# Patient Record
Sex: Female | Born: 1970
Health system: Southern US, Academic
[De-identification: ages and names within clinical notes are randomized; demographics above are authoritative.]

## PROBLEM LIST (undated history)

## (undated) ENCOUNTER — Encounter

## (undated) ENCOUNTER — Telehealth

## (undated) ENCOUNTER — Telehealth: Attending: Registered" | Primary: Registered"

## (undated) ENCOUNTER — Ambulatory Visit: Payer: MEDICARE | Attending: Registered" | Primary: Registered"

## (undated) ENCOUNTER — Ambulatory Visit: Payer: MEDICARE

## (undated) ENCOUNTER — Telehealth: Attending: Clinical | Primary: Clinical

## (undated) ENCOUNTER — Ambulatory Visit

## (undated) ENCOUNTER — Encounter: Attending: Physical Medicine & Rehabilitation | Primary: Physical Medicine & Rehabilitation

## (undated) ENCOUNTER — Encounter: Attending: Registered" | Primary: Registered"

## (undated) ENCOUNTER — Inpatient Hospital Stay

## (undated) ENCOUNTER — Ambulatory Visit: Payer: MEDICARE | Attending: Physical Medicine & Rehabilitation | Primary: Physical Medicine & Rehabilitation

## (undated) ENCOUNTER — Encounter: Attending: Internal Medicine | Primary: Internal Medicine

## (undated) ENCOUNTER — Telehealth: Attending: Social Worker | Primary: Social Worker

## (undated) ENCOUNTER — Ambulatory Visit: Payer: MEDICARE | Attending: Addiction (Substance Use Disorder) | Primary: Addiction (Substance Use Disorder)

## (undated) ENCOUNTER — Ambulatory Visit: Payer: MEDICARE | Attending: Family | Primary: Family

## (undated) ENCOUNTER — Non-Acute Institutional Stay: Payer: MEDICARE

## (undated) ENCOUNTER — Ambulatory Visit: Attending: Social Worker | Primary: Social Worker

## (undated) ENCOUNTER — Encounter: Payer: MEDICARE | Attending: Physical Medicine & Rehabilitation | Primary: Physical Medicine & Rehabilitation

## (undated) ENCOUNTER — Encounter
Attending: Student in an Organized Health Care Education/Training Program | Primary: Student in an Organized Health Care Education/Training Program

## (undated) ENCOUNTER — Ambulatory Visit: Payer: MEDICARE | Attending: Social Worker | Primary: Social Worker

## (undated) ENCOUNTER — Telehealth: Attending: Family | Primary: Family

## (undated) ENCOUNTER — Encounter: Attending: Family Medicine | Primary: Family Medicine

## (undated) ENCOUNTER — Encounter: Attending: Social Worker | Primary: Social Worker

## (undated) ENCOUNTER — Encounter
Payer: MEDICARE | Attending: Rehabilitative and Restorative Service Providers" | Primary: Rehabilitative and Restorative Service Providers"

## (undated) ENCOUNTER — Ambulatory Visit: Payer: MEDICARE | Attending: Physician Assistant | Primary: Physician Assistant

## (undated) ENCOUNTER — Encounter: Payer: MEDICARE | Attending: Registered" | Primary: Registered"

## (undated) ENCOUNTER — Telehealth: Attending: Adult Health | Primary: Adult Health

## (undated) ENCOUNTER — Telehealth: Attending: Internal Medicine | Primary: Internal Medicine

## (undated) ENCOUNTER — Encounter: Attending: Family | Primary: Family

## (undated) ENCOUNTER — Encounter: Payer: MEDICARE | Attending: Orthopaedic Surgery | Primary: Orthopaedic Surgery

## (undated) DIAGNOSIS — I10 Essential (primary) hypertension: Secondary | ICD-10-CM

## (undated) DIAGNOSIS — G9050 Complex regional pain syndrome I, unspecified: Secondary | ICD-10-CM

## (undated) HISTORY — PX: LITHOTRIPSY: SUR834

## (undated) HISTORY — PX: TONSILLECTOMY: SUR1361

## (undated) MED ORDER — OXYCODONE-ACETAMINOPHEN 7.5 MG-325 MG TABLET: tablet | 0 refills | 0 days

## (undated) MED ORDER — OXYCODONE-ACETAMINOPHEN 7.5 MG-325 MG TABLET: tablet | Freq: Three times a day (TID) | 0 refills | 0 days | Status: CN

## (undated) MED ORDER — MULTIVITAMIN TABLET: Freq: Every day | ORAL | 0 days

## (undated) MED ORDER — MORPHINE ER 15 MG TABLET,EXTENDED RELEASE: 15 mg | tablet | Freq: Two times a day (BID) | 0 refills | 30 days

## (undated) MED ORDER — CENTRUM ADULT 50 PLUS FRESH-FRUITY 120 MCG CHEWABLE TABLET: Freq: Every day | ORAL | 0 days

## (undated) MED ORDER — OXYCODONE-ACETAMINOPHEN 7.5 MG-325 MG TABLET: 1 | tablet | Freq: Four times a day (QID) | 0 refills | 23 days | Status: CN

---
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
## 2005-04-18 ENCOUNTER — Emergency Department: Payer: Self-pay | Admitting: Internal Medicine

## 2005-06-05 ENCOUNTER — Emergency Department: Payer: Self-pay | Admitting: Internal Medicine

## 2005-10-16 ENCOUNTER — Ambulatory Visit: Payer: Self-pay | Admitting: Family Medicine

## 2005-12-16 ENCOUNTER — Emergency Department: Payer: Self-pay | Admitting: Emergency Medicine

## 2006-04-25 ENCOUNTER — Ambulatory Visit: Payer: Self-pay | Admitting: Obstetrics and Gynecology

## 2006-10-31 ENCOUNTER — Emergency Department: Payer: Self-pay | Admitting: Emergency Medicine

## 2007-10-09 IMAGING — US US PELV - US TRANSVAGINAL
1 series · 17 of 25 positions shown · non-contrast
Comparison: none

REASON FOR EXAM: Leiomyoma
COMMENTS:

[Series 1: us pelv - us transvaginal · 17 of 44 slices shown]
[im 1/44]
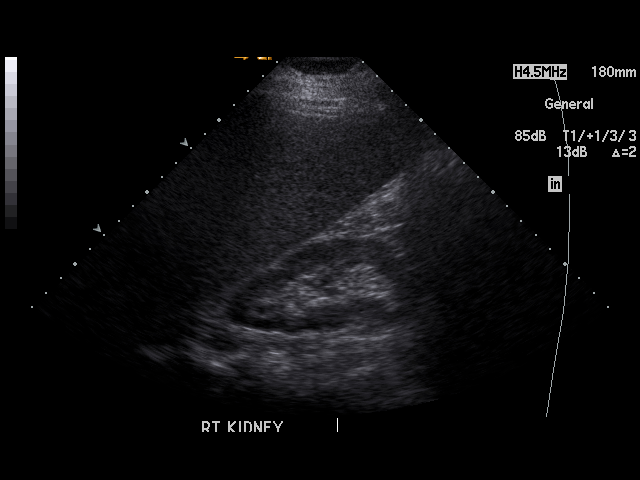
[im 4/44]
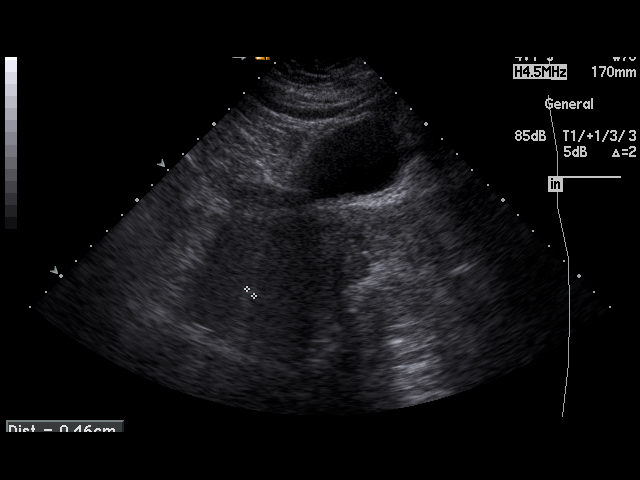
[im 6/44]
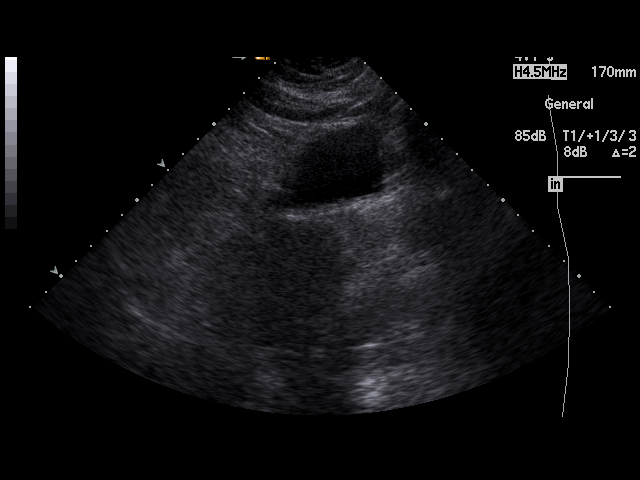
[im 9/44]
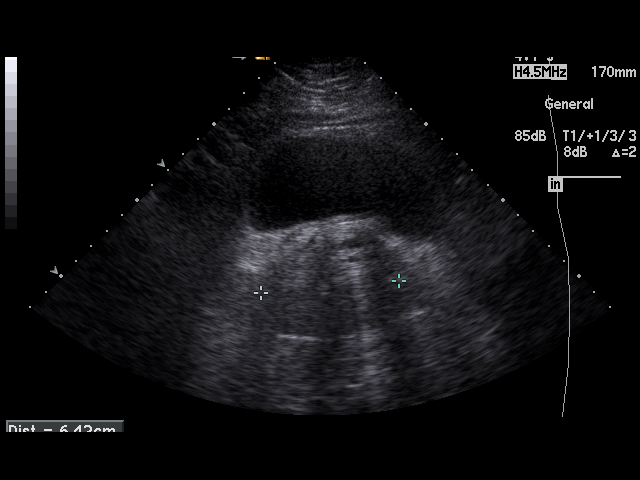
[im 11/44]
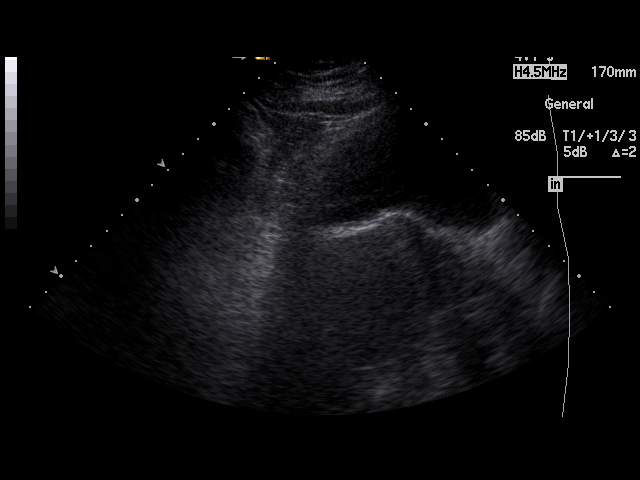
[im 15/44]
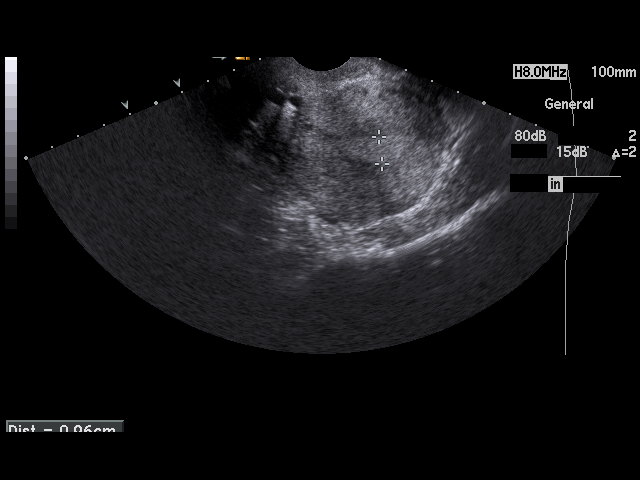
[im 17/44]
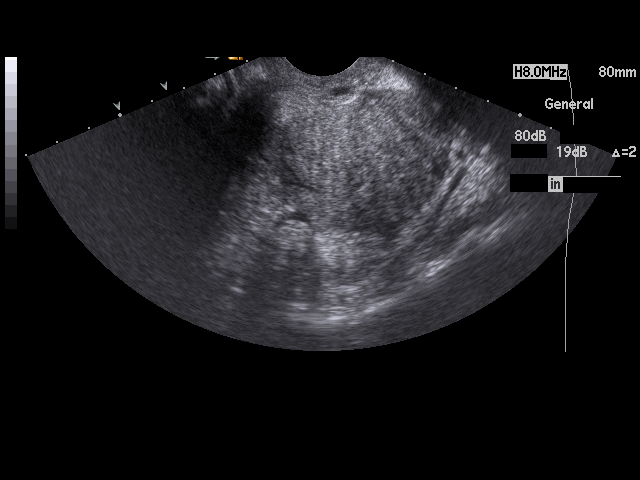
[im 20/44]
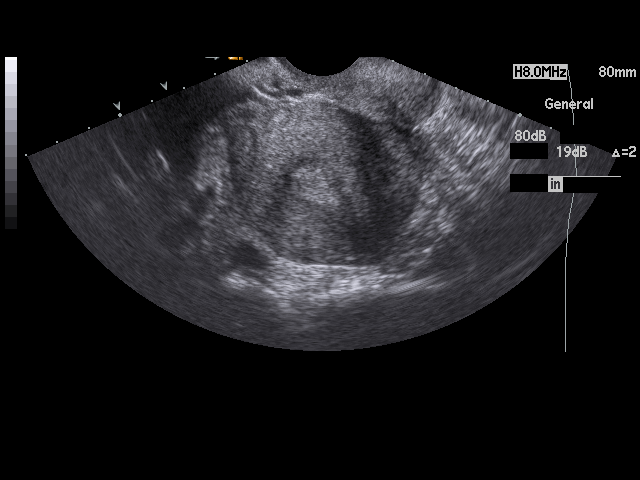
[im 22/44]
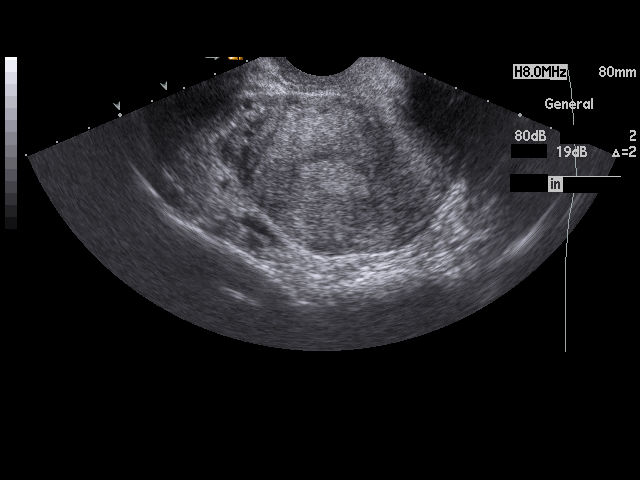
[im 24/44]
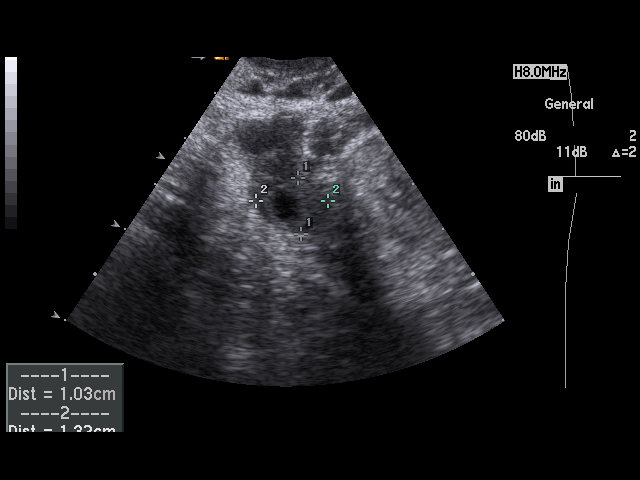
[im 27/44]
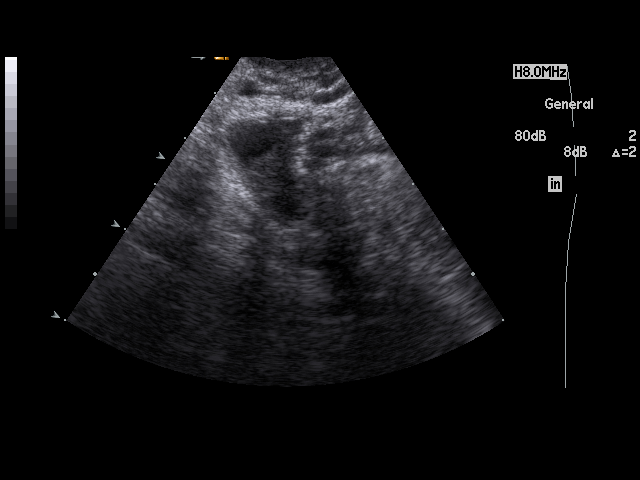
[im 29/44]
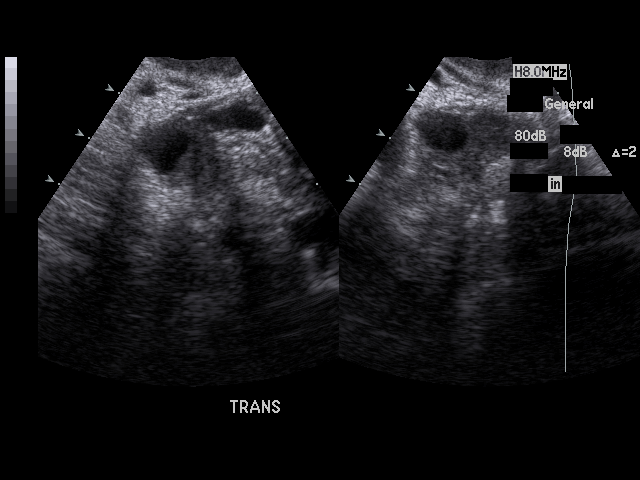
[im 33/44]
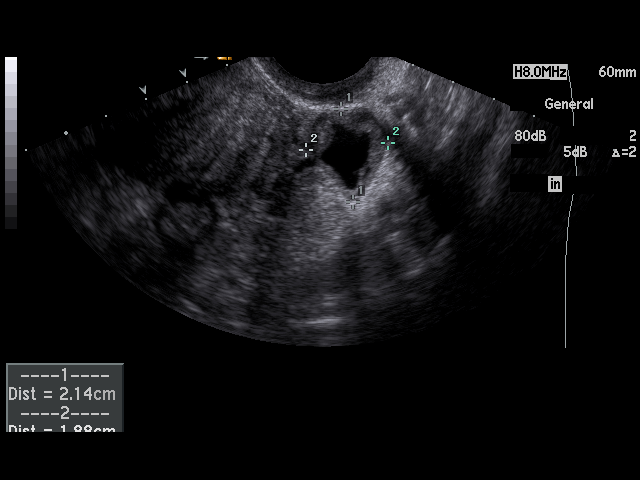
[im 35/44]
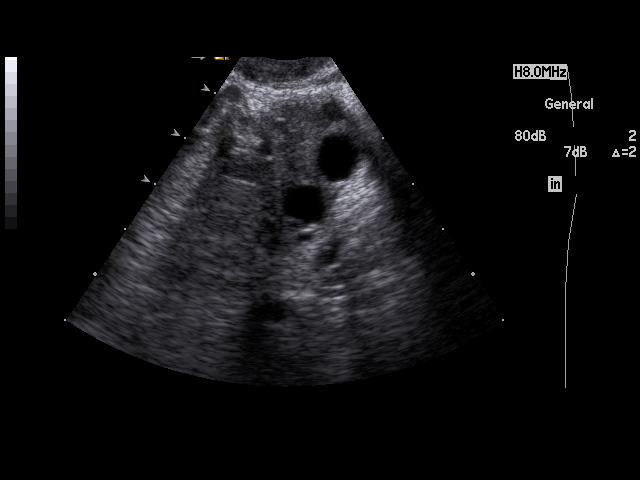
[im 38/44]
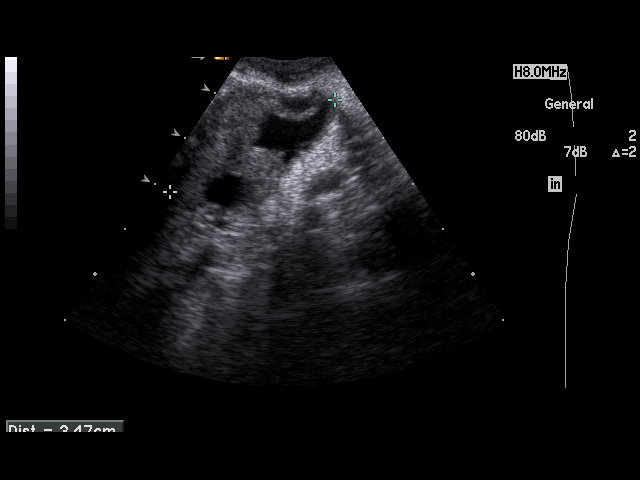
[im 40/44]
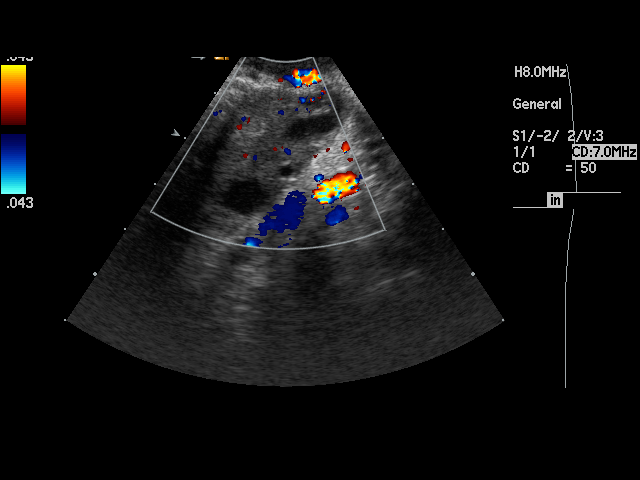
[im 44/44]
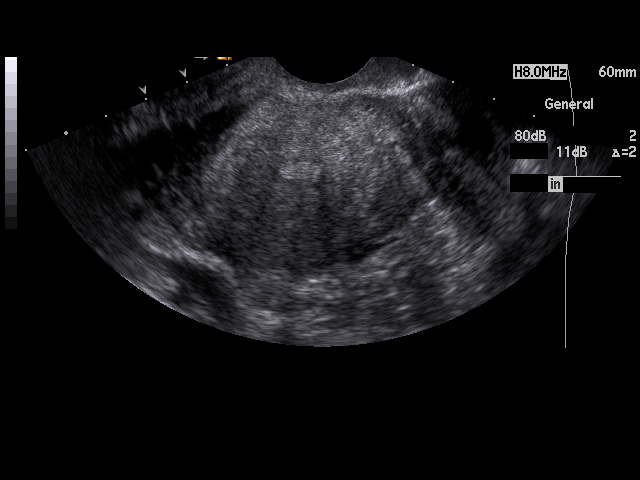

[17 of 25 positions shown; findings below may reference images not displayed]

PROCEDURE:     US  - US PELVIS MASS EXAM  - [DATE] [DATE] [DATE]  [DATE]

RESULT:     Transabdominal and endovaginal ultrasound was performed.

The uterus appears retroflexed. The uterus measures 9.4 cm x 5.27 cm x
cm. No uterine mass lesions are identified. The endometrium measures 9.6 mm
in thickness which is within the limits of normal. The RIGHT and LEFT
ovaries are visualized. The RIGHT ovary measures 2.47 cm at maximum diameter
and the LEFT ovary measures 3.47 cm at maximum diameter. There are multiple
cysts in the LEFT ovary. The largest measures 1.77 cm at maximum diameter
and appears irregular. This likely represents a collapsing cyst. A few,
follicular cysts are seen in the RIGHT ovary. No abnormal adnexal masses are
seen. There is no free fluid in the pelvis.
IMPRESSION: 1.     No uterine mass lesion is identified.
2.     There is a 1.77 cm, irregular cyst of the LEFT ovary that appears to
likely represent a collapsing cyst.
3.     No free fluid is identified in the pelvis.
4.     The uterus is retroflexed.

## 2010-08-28 ENCOUNTER — Ambulatory Visit: Payer: Self-pay | Admitting: Family Medicine

## 2010-10-03 ENCOUNTER — Emergency Department: Payer: Self-pay | Admitting: Unknown Physician Specialty

## 2010-10-10 ENCOUNTER — Ambulatory Visit: Payer: Self-pay | Admitting: Family Medicine

## 2010-10-13 ENCOUNTER — Ambulatory Visit: Payer: Self-pay | Admitting: Urology

## 2016-06-18 ENCOUNTER — Ambulatory Visit: Admission: RE | Admit: 2016-06-18 | Discharge: 1898-01-08 | Attending: Infectious Disease

## 2016-07-09 ENCOUNTER — Ambulatory Visit
Admission: RE | Admit: 2016-07-09 | Discharge: 2016-07-09 | Disposition: A | Discharge status: Home / Self Care | Payer: MEDICARE

## 2016-07-09 DIAGNOSIS — R1030 Lower abdominal pain, unspecified: Principal | ICD-10-CM

## 2016-07-16 ENCOUNTER — Ambulatory Visit
Admission: RE | Admit: 2016-07-16 | Discharge: 2016-07-16 | Disposition: A | Discharge status: Home / Self Care | Payer: MEDICARE

## 2016-07-16 DIAGNOSIS — M545 Low back pain: Principal | ICD-10-CM

## 2016-07-19 MED ORDER — NORETHINDRONE ACETATE 5 MG TABLET
ORAL_TABLET | Freq: Every day | ORAL | 11 refills | 0.00000 days | Status: CP
Start: 2016-07-19 — End: 2016-07-27

## 2016-07-24 ENCOUNTER — Ambulatory Visit: Admission: RE | Admit: 2016-07-24 | Discharge: 2016-07-24 | Payer: MEDICARE

## 2016-07-24 DIAGNOSIS — Z006 Encounter for examination for normal comparison and control in clinical research program: Principal | ICD-10-CM

## 2016-07-24 MED ORDER — PRAVASTATIN 10 MG TABLET
ORAL_TABLET | Freq: Every evening | ORAL | 3 refills | 0 days | Status: CP
Start: 2016-07-24 — End: 2016-08-22

## 2016-07-24 NOTE — Unmapped (Signed)
Pharmacy called at 12:145 pm  Pt needs refill of    pravastatin (PRAVACHOL) 10 MG

## 2016-07-27 MED ORDER — NORETHINDRONE ACETATE 5 MG TABLET
ORAL_TABLET | Freq: Every day | ORAL | 11 refills | 0.00000 days | Status: CP
Start: 2016-07-27 — End: 2017-07-17

## 2016-08-16 NOTE — Unmapped (Signed)
Specialty Pharmacy Refill Coordination Note     Patty Bailey is a 46 y.o. female contacted today regarding refills of her specialty medication(s).    Reviewed and verified with patient:      Specialty medication(s) and dose(s) confirmed: yes  Changes to medications: no  Changes to insurance: no    Medication Adherence    Patient reported X missed doses in the last month:  0  Specialty Medication:  TIVICAY  Patient is on additional specialty medications:  No  Informant:  patient  Confirmed plan for next specialty medication refill:  delivery by pharmacy  Medication Assistance Program  Refill Coordination  Has the Patient's Contact Information Changed:  No  Is the Shipping Address Different:  No  Shipping Information  Delivery Scheduled:  Yes  Delivery Date:  08/22/16  Medications to be Shipped:  ONLY TIVICAY NEEDED AT THIS TIME.          Follow-up: 3 week(s)     Westley Gambles  Specialty Pharmacy Technician

## 2016-08-17 LAB — HIV RNA, QUANTITATIVE, PCR: HIV RNA QNT RSLT: NOT DETECTED

## 2016-08-17 LAB — HIV RNA: Lab: <20

## 2016-08-17 MED ORDER — DOLUTEGRAVIR 50 MG TABLET
ORAL_TABLET | Freq: Every day | ORAL | 0 refills | 0 days | Status: CP
Start: 2016-08-17 — End: 2017-07-17

## 2016-08-17 MED ORDER — EMTRICITABINE 200 MG-TENOFOVIR ALAFENAMIDE FUMARATE 25 MG TABLET
ORAL_TABLET | Freq: Every day | ORAL | 0 refills | 0 days | Status: CP
Start: 2016-08-17 — End: 2017-07-17

## 2016-08-22 MED ORDER — PRAVASTATIN 10 MG TABLET
ORAL_TABLET | Freq: Every evening | ORAL | 3 refills | 0.00000 days | Status: SS
Start: 2016-08-22 — End: 2017-11-24

## 2016-08-22 MED ORDER — OMEPRAZOLE 20 MG CAPSULE,DELAYED RELEASE
ORAL_CAPSULE | Freq: Every day | ORAL | 3 refills | 0 days | Status: CP
Start: 2016-08-22 — End: 2017-07-17

## 2016-08-22 MED ORDER — RIZATRIPTAN 5 MG TABLET
ORAL_TABLET | Freq: Once | ORAL | 2 refills | 0 days | Status: CP | PRN
Start: 2016-08-22 — End: 2017-08-23

## 2016-08-22 MED ORDER — DOCUSATE SODIUM 50 MG CAPSULE
ORAL_CAPSULE | Freq: Two times a day (BID) | ORAL | 3 refills | 0 days | Status: CP
Start: 2016-08-22 — End: 2017-11-24

## 2016-08-22 MED ORDER — ALLOPURINOL 100 MG TABLET
ORAL_TABLET | Freq: Two times a day (BID) | ORAL | 3 refills | 0.00000 days | Status: CP
Start: 2016-08-22 — End: 2017-11-24

## 2016-08-22 MED ORDER — LOSARTAN 50 MG-HYDROCHLOROTHIAZIDE 12.5 MG TABLET
ORAL_TABLET | Freq: Every day | ORAL | 3 refills | 0 days | Status: CP
Start: 2016-08-22 — End: 2017-11-24

## 2016-08-22 NOTE — Unmapped (Signed)
Patient called requesting enough refills of all of her medications to be sent into [facility] to last her until she can find a new provider in [place]. They are moving on September 02, 2016 and so she would like this done ASAP, (please)

## 2016-08-23 NOTE — Unmapped (Signed)
A 12-lead resting electrocardiogram (ECG) was performed as part of the [facility] Study ([id number]). These studies were performed as part of a research visit and were not part of the patient???s routine medical care.     Research ECG tracings have been reviewed by the study Cardiologist, Dr. Mariane Baumgarten, who provided a final interpretation of ECG results. A copy of the tracing with final interpretation has been uploaded to Epic for documentation purposes.

## 2016-08-24 ENCOUNTER — Ambulatory Visit: Admission: RE | Admit: 2016-08-24 | Discharge: 2016-08-24 | Payer: MEDICARE

## 2016-08-24 DIAGNOSIS — Z006 Encounter for examination for normal comparison and control in clinical research program: Principal | ICD-10-CM

## 2016-08-28 MED FILL — TIVICAY/50MG/TAB: TIVICAY/50MG/TAB | 90 days supply | Qty: 90 | Fill #1

## 2016-08-29 MED FILL — DESCOVY/200MG/25MG/TABS: DESCOVY/200MG/25MG/TABS | 90 days supply | Qty: 90 | Fill #0

## 2017-07-17 ENCOUNTER — Encounter: Admit: 2017-07-17 | Discharge: 2017-07-18 | Payer: MEDICARE

## 2017-07-17 DIAGNOSIS — I10 Essential (primary) hypertension: Secondary | ICD-10-CM

## 2017-07-17 DIAGNOSIS — K219 Gastro-esophageal reflux disease without esophagitis: Secondary | ICD-10-CM

## 2017-07-17 DIAGNOSIS — B20 Human immunodeficiency virus [HIV] disease: Principal | ICD-10-CM

## 2017-07-17 MED ORDER — DOLUTEGRAVIR 50 MG TABLET
ORAL_TABLET | Freq: Every day | ORAL | 11 refills | 0.00000 days | Status: CP
Start: 2017-07-17 — End: 2018-02-26

## 2017-07-17 MED ORDER — PRAZOSIN 2 MG CAPSULE
ORAL_CAPSULE | Freq: Every evening | ORAL | 0 refills | 0.00000 days | Status: CP
Start: 2017-07-17 — End: ?

## 2017-07-17 MED ORDER — EMTRICITABINE 200 MG-TENOFOVIR ALAFENAMIDE FUMARATE 25 MG TABLET
ORAL_TABLET | Freq: Every day | ORAL | 11 refills | 0 days | Status: CP
Start: 2017-07-17 — End: 2018-02-26

## 2017-07-17 MED ORDER — PROPRANOLOL 20 MG TABLET
ORAL_TABLET | Freq: Two times a day (BID) | ORAL | 0 refills | 0 days | Status: CP
Start: 2017-07-17 — End: 2017-08-21

## 2017-07-17 MED ORDER — OMEPRAZOLE 20 MG CAPSULE,DELAYED RELEASE
ORAL_CAPSULE | Freq: Every day | ORAL | 1 refills | 0.00000 days | Status: CP
Start: 2017-07-17 — End: 2017-09-13

## 2017-08-21 MED ORDER — PROPRANOLOL 20 MG TABLET
ORAL_TABLET | Freq: Two times a day (BID) | ORAL | 0 refills | 0 days | Status: CP
Start: 2017-08-21 — End: 2017-09-25

## 2017-09-16 MED ORDER — OMEPRAZOLE 20 MG CAPSULE,DELAYED RELEASE
ORAL_CAPSULE | Freq: Every day | ORAL | 1 refills | 0 days | Status: CP
Start: 2017-09-16 — End: 2017-11-18

## 2017-09-25 MED ORDER — PROPRANOLOL 20 MG TABLET
ORAL_TABLET | Freq: Two times a day (BID) | ORAL | 1 refills | 0 days | Status: CP
Start: 2017-09-25 — End: 2017-11-24

## 2017-11-04 ENCOUNTER — Ambulatory Visit: Admit: 2017-11-04 | Discharge: 2017-11-04 | Payer: MEDICARE

## 2017-11-04 ENCOUNTER — Encounter: Admit: 2017-11-04 | Discharge: 2017-11-04 | Payer: MEDICARE

## 2017-11-04 DIAGNOSIS — N63 Unspecified lump in unspecified breast: Principal | ICD-10-CM

## 2017-11-18 MED ORDER — OMEPRAZOLE 20 MG CAPSULE,DELAYED RELEASE
ORAL_CAPSULE | Freq: Every day | ORAL | 1 refills | 0.00000 days | Status: CP
Start: 2017-11-18 — End: 2017-12-16

## 2017-11-22 ENCOUNTER — Encounter: Admit: 2017-11-22 | Discharge: 2017-11-24 | Payer: MEDICARE

## 2017-11-22 ENCOUNTER — Ambulatory Visit: Admit: 2017-11-22 | Discharge: 2017-11-24 | Payer: MEDICARE

## 2017-11-22 DIAGNOSIS — R05 Cough: Principal | ICD-10-CM

## 2017-11-23 DIAGNOSIS — R05 Cough: Principal | ICD-10-CM

## 2017-11-24 MED ORDER — BENZONATATE 200 MG CAPSULE
ORAL_CAPSULE | Freq: Three times a day (TID) | ORAL | 0 refills | 0.00000 days | Status: CP | PRN
Start: 2017-11-24 — End: 2017-12-01

## 2017-11-24 MED ORDER — SENNOSIDES 8.6 MG TABLET
ORAL_TABLET | Freq: Every day | ORAL | 0 refills | 0.00000 days | Status: CP | PRN
Start: 2017-11-24 — End: 2017-12-24

## 2017-11-24 MED ORDER — FLUCONAZOLE 150 MG TABLET
ORAL_TABLET | Freq: Once | ORAL | 0 refills | 0 days | Status: CP | PRN
Start: 2017-11-24 — End: ?

## 2017-11-24 MED ORDER — ALBUTEROL SULFATE HFA 90 MCG/ACTUATION AEROSOL INHALER
Freq: Four times a day (QID) | RESPIRATORY_TRACT | 3 refills | 0.00000 days | Status: CP | PRN
Start: 2017-11-24 — End: 2018-04-14

## 2017-11-24 MED ORDER — LOSARTAN 25 MG TABLET
ORAL_TABLET | Freq: Every day | ORAL | 0 refills | 0 days | Status: CP
Start: 2017-11-24 — End: 2018-02-22

## 2017-11-24 MED ORDER — PRAVASTATIN 20 MG TABLET
ORAL_TABLET | Freq: Every evening | ORAL | 0 refills | 0 days | Status: CP
Start: 2017-11-24 — End: 2018-03-03

## 2017-11-24 MED ORDER — CEFDINIR 300 MG CAPSULE
ORAL_CAPSULE | Freq: Two times a day (BID) | ORAL | 0 refills | 0.00000 days | Status: CP
Start: 2017-11-24 — End: 2017-11-26

## 2017-11-24 MED ORDER — AZITHROMYCIN 250 MG TABLET
ORAL_TABLET | Freq: Every day | ORAL | 0 refills | 0 days | Status: CP
Start: 2017-11-24 — End: 2017-11-26

## 2017-11-24 MED ORDER — POLYETHYLENE GLYCOL 3350 17 GRAM/DOSE ORAL POWDER
Freq: Two times a day (BID) | ORAL | 0 refills | 0 days | Status: CP
Start: 2017-11-24 — End: 2017-12-24

## 2017-11-26 ENCOUNTER — Encounter: Admit: 2017-11-26 | Discharge: 2017-11-27 | Payer: MEDICARE

## 2017-11-26 DIAGNOSIS — R0609 Other forms of dyspnea: Secondary | ICD-10-CM

## 2017-11-26 DIAGNOSIS — G4733 Obstructive sleep apnea (adult) (pediatric): Secondary | ICD-10-CM

## 2017-11-26 DIAGNOSIS — B20 Human immunodeficiency virus [HIV] disease: Principal | ICD-10-CM

## 2017-11-26 DIAGNOSIS — F314 Bipolar disorder, current episode depressed, severe, without psychotic features: Secondary | ICD-10-CM

## 2017-11-26 DIAGNOSIS — R05 Cough: Secondary | ICD-10-CM

## 2017-11-26 DIAGNOSIS — R740 Nonspecific elevation of levels of transaminase and lactic acid dehydrogenase [LDH]: Secondary | ICD-10-CM

## 2017-11-26 DIAGNOSIS — J189 Pneumonia, unspecified organism: Secondary | ICD-10-CM

## 2017-12-16 MED ORDER — OMEPRAZOLE 20 MG CAPSULE,DELAYED RELEASE
ORAL_CAPSULE | Freq: Every day | ORAL | 1 refills | 0 days | Status: CP
Start: 2017-12-16 — End: 2018-01-14

## 2017-12-19 DIAGNOSIS — G4733 Obstructive sleep apnea (adult) (pediatric): Secondary | ICD-10-CM

## 2017-12-19 DIAGNOSIS — R05 Cough: Principal | ICD-10-CM

## 2017-12-20 ENCOUNTER — Encounter: Admit: 2017-12-20 | Discharge: 2017-12-21 | Payer: MEDICARE

## 2018-01-15 MED ORDER — OMEPRAZOLE 20 MG CAPSULE,DELAYED RELEASE
ORAL_CAPSULE | Freq: Every day | ORAL | 1 refills | 0 days | Status: CP
Start: 2018-01-15 — End: 2018-03-03

## 2018-01-28 ENCOUNTER — Ambulatory Visit: Admit: 2018-01-28 | Discharge: 2018-01-29 | Payer: MEDICARE

## 2018-01-28 ENCOUNTER — Encounter: Admit: 2018-01-28 | Discharge: 2018-01-29 | Payer: MEDICARE

## 2018-01-28 DIAGNOSIS — R0609 Other forms of dyspnea: Secondary | ICD-10-CM

## 2018-01-28 DIAGNOSIS — G4733 Obstructive sleep apnea (adult) (pediatric): Secondary | ICD-10-CM

## 2018-01-28 DIAGNOSIS — R05 Cough: Principal | ICD-10-CM

## 2018-02-26 ENCOUNTER — Encounter: Admit: 2018-02-26 | Discharge: 2018-02-26 | Payer: MEDICARE | Attending: Registered" | Primary: Registered"

## 2018-02-26 ENCOUNTER — Encounter: Admit: 2018-02-26 | Discharge: 2018-02-26 | Payer: MEDICARE

## 2018-02-26 DIAGNOSIS — Z6841 Body Mass Index (BMI) 40.0 and over, adult: Principal | ICD-10-CM

## 2018-02-26 DIAGNOSIS — F3289 Other specified depressive episodes: Secondary | ICD-10-CM

## 2018-02-26 DIAGNOSIS — R799 Abnormal finding of blood chemistry, unspecified: Secondary | ICD-10-CM

## 2018-02-26 DIAGNOSIS — B20 Human immunodeficiency virus [HIV] disease: Secondary | ICD-10-CM

## 2018-02-26 MED ORDER — TIVICAY 50 MG TABLET
ORAL_TABLET | Freq: Every day | ORAL | 11 refills | 0.00000 days | Status: CP
Start: 2018-02-26 — End: 2019-02-21

## 2018-02-26 MED ORDER — DESCOVY 200 MG-25 MG TABLET
ORAL_TABLET | Freq: Every day | ORAL | 11 refills | 0 days | Status: CP
Start: 2018-02-26 — End: ?

## 2018-03-03 ENCOUNTER — Encounter: Admit: 2018-03-03 | Discharge: 2018-03-04 | Payer: MEDICARE

## 2018-03-03 DIAGNOSIS — F314 Bipolar disorder, current episode depressed, severe, without psychotic features: Principal | ICD-10-CM

## 2018-03-03 DIAGNOSIS — Z6841 Body Mass Index (BMI) 40.0 and over, adult: Secondary | ICD-10-CM

## 2018-03-03 DIAGNOSIS — E782 Mixed hyperlipidemia: Secondary | ICD-10-CM

## 2018-03-03 DIAGNOSIS — B20 Human immunodeficiency virus [HIV] disease: Secondary | ICD-10-CM

## 2018-03-03 MED ORDER — ATORVASTATIN 40 MG TABLET
ORAL_TABLET | Freq: Every day | ORAL | 3 refills | 0 days | Status: CP
Start: 2018-03-03 — End: 2019-03-03

## 2018-03-03 MED ORDER — PRAVASTATIN 20 MG TABLET
ORAL_TABLET | Freq: Every evening | ORAL | 3 refills | 0.00000 days | Status: CP
Start: 2018-03-03 — End: 2018-03-03

## 2018-03-10 ENCOUNTER — Encounter: Admit: 2018-03-10 | Discharge: 2018-03-11 | Payer: MEDICARE

## 2018-03-10 DIAGNOSIS — R599 Enlarged lymph nodes, unspecified: Principal | ICD-10-CM

## 2018-03-10 DIAGNOSIS — J189 Pneumonia, unspecified organism: Principal | ICD-10-CM

## 2018-03-10 DIAGNOSIS — M5136 Other intervertebral disc degeneration, lumbar region: Principal | ICD-10-CM

## 2018-03-18 MED ORDER — OMEPRAZOLE 20 MG TABLET,DELAYED RELEASE
ORAL_TABLET | Freq: Every day | ORAL | 3 refills | 0.00000 days | Status: CP
Start: 2018-03-18 — End: 2019-03-18

## 2018-04-09 MED ORDER — ALLOPURINOL 100 MG TABLET
Freq: Two times a day (BID) | ORAL | 3 refills | 0 days | Status: CP
Start: 2018-04-09 — End: ?

## 2018-04-14 MED ORDER — ALBUTEROL SULFATE HFA 90 MCG/ACTUATION AEROSOL INHALER
Freq: Four times a day (QID) | RESPIRATORY_TRACT | 3 refills | 0 days | Status: CP | PRN
Start: 2018-04-14 — End: 2018-05-06

## 2018-05-06 ENCOUNTER — Encounter: Admit: 2018-05-06 | Discharge: 2018-05-07 | Payer: MEDICARE

## 2018-05-06 DIAGNOSIS — R0609 Other forms of dyspnea: Secondary | ICD-10-CM

## 2018-05-06 DIAGNOSIS — G4733 Obstructive sleep apnea (adult) (pediatric): Principal | ICD-10-CM

## 2018-05-06 DIAGNOSIS — Z720 Tobacco use: Secondary | ICD-10-CM

## 2018-05-06 MED ORDER — ALBUTEROL SULFATE HFA 90 MCG/ACTUATION AEROSOL INHALER
Freq: Four times a day (QID) | RESPIRATORY_TRACT | 6 refills | 0 days | Status: CP | PRN
Start: 2018-05-06 — End: ?

## 2018-05-28 ENCOUNTER — Encounter: Admit: 2018-05-28 | Discharge: 2018-05-29 | Payer: MEDICARE

## 2018-05-30 MED ORDER — RIZATRIPTAN 5 MG TABLET
ORAL_TABLET | Freq: Once | ORAL | 0 refills | 0 days | Status: CP | PRN
Start: 2018-05-30 — End: 2018-07-04

## 2018-07-02 ENCOUNTER — Encounter: Admit: 2018-07-02 | Discharge: 2018-07-03 | Payer: MEDICARE

## 2018-07-02 DIAGNOSIS — R609 Edema, unspecified: Principal | ICD-10-CM

## 2018-07-04 MED ORDER — RIZATRIPTAN 5 MG TABLET
0 refills | 0 days | Status: CP
Start: 2018-07-04 — End: 2018-09-05

## 2018-07-05 ENCOUNTER — Emergency Department
Admission: EM | Admit: 2018-07-05 | Discharge: 2018-07-05 | Disposition: A | Discharge status: Home / Self Care | Payer: Medicare Other | Attending: Emergency Medicine | Admitting: Emergency Medicine

## 2018-07-05 ENCOUNTER — Encounter: Payer: Self-pay | Admitting: Emergency Medicine

## 2018-07-05 ENCOUNTER — Other Ambulatory Visit: Payer: Self-pay

## 2018-07-05 DIAGNOSIS — M545 Low back pain: Secondary | ICD-10-CM | POA: Diagnosis present

## 2018-07-05 DIAGNOSIS — F17210 Nicotine dependence, cigarettes, uncomplicated: Secondary | ICD-10-CM | POA: Insufficient documentation

## 2018-07-05 DIAGNOSIS — G8929 Other chronic pain: Secondary | ICD-10-CM | POA: Diagnosis not present

## 2018-07-05 DIAGNOSIS — M5442 Lumbago with sciatica, left side: Secondary | ICD-10-CM | POA: Diagnosis not present

## 2018-07-05 DIAGNOSIS — M5440 Lumbago with sciatica, unspecified side: Secondary | ICD-10-CM

## 2018-07-05 HISTORY — DX: Complex regional pain syndrome I, unspecified: G90.50

## 2018-07-05 MED ORDER — METHYLPREDNISOLONE SODIUM SUCC 125 MG IJ SOLR
125.0000 mg | Freq: Once | INTRAMUSCULAR | Status: AC
  Administered 2018-07-05: 125 mg via INTRAMUSCULAR
  Filled 2018-07-05: qty 2

## 2018-07-05 MED ORDER — HYDROMORPHONE HCL 1 MG/ML IJ SOLN
1.0000 mg | Freq: Once | INTRAMUSCULAR | Status: AC
  Administered 2018-07-05: 1 mg via INTRAMUSCULAR
  Filled 2018-07-05: qty 1

## 2018-07-05 MED ORDER — ORPHENADRINE CITRATE 30 MG/ML IJ SOLN
60.0000 mg | Freq: Two times a day (BID) | INTRAMUSCULAR | Status: DC
  Administered 2018-07-05: 18:00:00 60 mg via INTRAMUSCULAR
  Filled 2018-07-05: qty 2

## 2018-07-05 NOTE — ED Notes (Signed)
Pt reports "prickly-numbness and burning" from the shoulder blades to the L leg. DDS, spinal stenosis, and bone spurs history per pt. L dorsalis pedis pulse 2+; cap refill/warmth/color equal to R foot. Pt can move leg but states it causes inc pain.

## 2018-07-05 NOTE — Discharge Instructions (Addendum)
Advised to follow-up with your pain management doctor for definitive treatment.

## 2018-07-05 NOTE — ED Provider Notes (Signed)
[facility] Emergency Department Provider Note   ____________________________________________   First MD Initiated Contact with Patient 07/05/18 1657     (approximate)  I have reviewed the triage vital signs and the nursing notes.   HISTORY  Chief Complaint Back Pain    HPI Nicole Logan is a 48 y.o. female patient arrived via EMS complaining of radicular back pain to the left lower extremity.  Patient has a diagnosis of CRPS.  Patient is currently taking Percocets prescribed by PCP.  Patient 7.5 hydrocodone is not controlling her pain.  Patient states she is frustrated because pain management has not entertain any epidural injections for her chronic back pain.  Patient denies bladder bowel dysfunction.  No other cauda equina complaints.  Patient rates her pain as a 10/10.  Patient described pain is "achy/sharp".         Past Medical History:  Diagnosis Date  . CRPS (complex regional pain syndrome type I)     There are no active problems to display for this patient.   History reviewed. No pertinent surgical history.  Prior to Admission medications   Not on File    Allergies Ivp dye [iodinated diagnostic agents], Lisinopril, Morphine and related, and Tramadol  No family history on file.  Social History Social History   Tobacco Use  . Smoking status: Current Every Day Smoker    Packs/day: 1.00    Types: Cigarettes  . Smokeless tobacco: Never Used  Substance Use Topics  . Alcohol use: Not on file  . Drug use: Not on file    Review of Systems Constitutional: No fever/chills Eyes: No visual changes. ENT: No sore throat. Cardiovascular: Denies chest pain. Respiratory: Denies shortness of breath. Gastrointestinal: No abdominal pain.  No nausea, no vomiting.  No diarrhea.  No constipation. Genitourinary: Negative for dysuria. Musculoskeletal: Chronic back pain.   Skin: Negative for rash. Neurological: Negative for headaches, focal  weakness or numbness.  Allergic/Immunilogical: Toradol and tramadol. ____________________________________________   PHYSICAL EXAM:  VITAL SIGNS: ED Triage Vitals  Enc Vitals Group     BP 07/05/18 1514 (!) 133/96     Pulse Rate 07/05/18 1514 99     Resp 07/05/18 1514 20     Temp 07/05/18 1514 97.7 F (36.5 C)     Temp Source 07/05/18 1514 Oral     SpO2 07/05/18 1514 93 %     Weight 07/05/18 1509 300 lb (136.1 kg)     Height 07/05/18 1509 5' 5.5" (1.664 m)     Head Circumference --      Peak Flow --      Pain Score 07/05/18 1509 10     Pain Loc --      Pain Edu? --      Excl. in [place]? --     Constitutional: Alert and oriented. Well appearing and in no acute distress. No cervical spine tenderness to palpation. Hematological/Lymphatic/Immunilogical: No cervical lymphadenopathy. Cardiovascular: Normal rate, regular rhythm. Grossly normal heart sounds.  Good peripheral circulation. Respiratory: Normal respiratory effort.  No retractions. Lungs CTAB. Gastrointestinal: Soft and nontender. No distention. No abdominal bruits. No CVA tenderness. Musculoskeletal: Body habitus limits the exam.  No obvious spinal deformity.  Patient has moderate guarding of the right the lumbar spine.  No lower extremity tenderness nor edema.  No joint effusions. Neurologic:  Normal speech and language. No gross focal neurologic deficits are appreciated. No gait instability. Skin:  Skin is warm, dry and intact. No rash noted. Psychiatric:  Mood and affect are normal. Speech and behavior are normal.  ____________________________________________   LABS (all labs ordered are listed, but only abnormal results are displayed)  Labs Reviewed - No data to display ____________________________________________  EKG   ____________________________________________  RADIOLOGY  ED MD interpretation:    Official radiology report(s): No results  found.  ____________________________________________   PROCEDURES  Procedure(s) performed (including Critical Care):  Procedures   ____________________________________________   INITIAL IMPRESSION / ASSESSMENT AND PLAN / ED COURSE  As part of my medical decision making, I reviewed the following data within the electronic MEDICAL RECORD NUMBER        Nicole Logan was evaluated in Emergency Department on 07/05/2018 for the symptoms described in the history of present illness. She was evaluated in the context of the global COVID-19 pandemic, which necessitated consideration that the patient might be at risk for infection with the SARS-CoV-2 virus that causes COVID-19. Institutional protocols and algorithms that pertain to the evaluation of patients at risk for COVID-19 are in a state of rapid change based on information released by regulatory bodies including the CDC and federal and state organizations. These policies and algorithms were followed during the patient's care in the ED.    Patient presents with chronic back pain and request for stronger pain medication.  Discussed patient rationale why the ER is without does engage in chronic pain control.  Advised to discuss her pain with her complaint with pain management.  Due to the range of pain management no narcotic pain medication will be prescribed.  ____________________________________________   FINAL CLINICAL IMPRESSION(S) / ED DIAGNOSES  Final diagnoses:  Chronic low back pain with sciatica, sciatica laterality unspecified, unspecified back pain laterality     ED Discharge Orders    None       Note:  This document was prepared using Dragon voice recognition software and may include unintentional dictation errors.    Joni ReiningSmith,  K, PA-C 07/05/18 1754    Shaune PollackIsaacs, [place], MD 07/06/18 [phone]

## 2018-07-05 NOTE — ED Triage Notes (Signed)
Pt arrived via [facility] with reports of back pain that radiates down the left side.  Pt dx with CRPS by urgent care in the past.  Pt states she takes pain medication.  Pt states she is followed by pain management.  Pt states she is currently taking Percocet for pain, pt states she has not taken any pain medication today.  Pt states the pain has intensified and is getting worse. 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  Pt carrying large duffle bag that she is able to lift while sitting in wheelchair.

## 2018-07-07 ENCOUNTER — Institutional Professional Consult (permissible substitution): Admit: 2018-07-07 | Discharge: 2018-07-08 | Payer: MEDICARE

## 2018-07-07 DIAGNOSIS — R609 Edema, unspecified: Principal | ICD-10-CM

## 2018-07-08 ENCOUNTER — Encounter: Admit: 2018-07-08 | Discharge: 2018-07-09 | Payer: MEDICARE

## 2018-07-08 DIAGNOSIS — G4733 Obstructive sleep apnea (adult) (pediatric): Principal | ICD-10-CM

## 2018-07-08 DIAGNOSIS — R0609 Other forms of dyspnea: Secondary | ICD-10-CM

## 2018-07-08 DIAGNOSIS — Z720 Tobacco use: Secondary | ICD-10-CM

## 2018-07-21 ENCOUNTER — Encounter: Admit: 2018-07-21 | Discharge: 2018-07-22 | Payer: MEDICARE

## 2018-07-21 DIAGNOSIS — R739 Hyperglycemia, unspecified: Secondary | ICD-10-CM

## 2018-07-21 DIAGNOSIS — G5793 Unspecified mononeuropathy of bilateral lower limbs: Secondary | ICD-10-CM

## 2018-07-21 DIAGNOSIS — M5416 Radiculopathy, lumbar region: Secondary | ICD-10-CM

## 2018-07-21 DIAGNOSIS — M1610 Unilateral primary osteoarthritis, unspecified hip: Secondary | ICD-10-CM

## 2018-07-21 DIAGNOSIS — G9050 Complex regional pain syndrome I, unspecified: Secondary | ICD-10-CM

## 2018-07-21 DIAGNOSIS — F419 Anxiety disorder, unspecified: Secondary | ICD-10-CM

## 2018-07-21 DIAGNOSIS — F439 Reaction to severe stress, unspecified: Secondary | ICD-10-CM

## 2018-07-21 DIAGNOSIS — B20 Human immunodeficiency virus [HIV] disease: Secondary | ICD-10-CM

## 2018-07-21 DIAGNOSIS — F329 Major depressive disorder, single episode, unspecified: Secondary | ICD-10-CM

## 2018-07-21 DIAGNOSIS — G6289 Other specified polyneuropathies: Principal | ICD-10-CM

## 2018-07-21 MED ORDER — LIDOCAINE 5 % TOPICAL OINTMENT
Freq: Three times a day (TID) | TOPICAL | 11 refills | 0.00000 days | Status: CP
Start: 2018-07-21 — End: 2019-07-21

## 2018-07-22 ENCOUNTER — Encounter: Admit: 2018-07-22 | Discharge: 2018-07-23 | Payer: MEDICARE | Attending: Registered" | Primary: Registered"

## 2018-07-24 ENCOUNTER — Ambulatory Visit: Admit: 2018-07-24 | Discharge: 2018-07-25 | Payer: MEDICARE | Attending: Internal Medicine | Primary: Internal Medicine

## 2018-07-24 DIAGNOSIS — M1610 Unilateral primary osteoarthritis, unspecified hip: Principal | ICD-10-CM

## 2018-07-24 DIAGNOSIS — G629 Polyneuropathy, unspecified: Secondary | ICD-10-CM

## 2018-07-24 DIAGNOSIS — M1612 Unilateral primary osteoarthritis, left hip: Secondary | ICD-10-CM

## 2018-07-28 ENCOUNTER — Encounter: Admit: 2018-07-28 | Discharge: 2018-07-29 | Payer: MEDICARE

## 2018-07-28 DIAGNOSIS — F17200 Nicotine dependence, unspecified, uncomplicated: Principal | ICD-10-CM

## 2018-07-31 ENCOUNTER — Encounter: Admit: 2018-07-31 | Discharge: 2018-08-01 | Payer: MEDICARE | Attending: Internal Medicine | Primary: Internal Medicine

## 2018-07-31 DIAGNOSIS — M1612 Unilateral primary osteoarthritis, left hip: Principal | ICD-10-CM

## 2018-08-06 ENCOUNTER — Encounter: Admit: 2018-08-06 | Discharge: 2018-08-07 | Payer: MEDICARE | Attending: Social Worker | Primary: Social Worker

## 2018-08-06 DIAGNOSIS — F329 Major depressive disorder, single episode, unspecified: Secondary | ICD-10-CM

## 2018-08-06 DIAGNOSIS — F419 Anxiety disorder, unspecified: Principal | ICD-10-CM

## 2018-08-06 DIAGNOSIS — F439 Reaction to severe stress, unspecified: Secondary | ICD-10-CM

## 2018-08-22 ENCOUNTER — Encounter: Admit: 2018-08-22 | Discharge: 2018-08-23 | Payer: MEDICARE | Attending: Registered" | Primary: Registered"

## 2018-09-05 MED ORDER — RIZATRIPTAN 5 MG TABLET
0 refills | 0 days | Status: CP
Start: 2018-09-05 — End: ?

## 2018-09-11 ENCOUNTER — Institutional Professional Consult (permissible substitution): Admit: 2018-09-11 | Discharge: 2018-09-12

## 2018-09-12 ENCOUNTER — Encounter: Admit: 2018-09-12 | Discharge: 2018-09-13 | Payer: MEDICARE | Attending: Registered" | Primary: Registered"

## 2018-09-22 MED ORDER — LIDOCAINE 5 % TOPICAL OINTMENT
Freq: Three times a day (TID) | TOPICAL | 11 refills | 0.00000 days | Status: CP
Start: 2018-09-22 — End: 2019-09-22

## 2018-10-08 ENCOUNTER — Ambulatory Visit
Admit: 2018-10-08 | Discharge: 2018-10-09 | Payer: MEDICARE | Attending: Student in an Organized Health Care Education/Training Program | Primary: Student in an Organized Health Care Education/Training Program

## 2018-10-08 DIAGNOSIS — Z3480 Encounter for supervision of other normal pregnancy, unspecified trimester: Secondary | ICD-10-CM

## 2018-10-08 DIAGNOSIS — N939 Abnormal uterine and vaginal bleeding, unspecified: Secondary | ICD-10-CM

## 2018-10-08 DIAGNOSIS — B20 Human immunodeficiency virus [HIV] disease: Secondary | ICD-10-CM

## 2018-10-15 ENCOUNTER — Encounter: Admit: 2018-10-15 | Discharge: 2018-10-16 | Payer: MEDICARE | Attending: Registered" | Primary: Registered"

## 2018-10-17 ENCOUNTER — Ambulatory Visit: Admit: 2018-10-17 | Discharge: 2018-10-18 | Payer: MEDICARE

## 2018-10-17 DIAGNOSIS — B20 Human immunodeficiency virus [HIV] disease: Secondary | ICD-10-CM

## 2018-10-17 DIAGNOSIS — Z720 Tobacco use: Secondary | ICD-10-CM

## 2018-10-21 DIAGNOSIS — R600 Localized edema: Principal | ICD-10-CM

## 2018-10-21 MED ORDER — FUROSEMIDE 20 MG TABLET: 20 mg | tablet | Freq: Every day | 3 refills | 90 days | Status: AC

## 2018-10-22 ENCOUNTER — Encounter: Admit: 2018-10-22 | Discharge: 2018-10-23 | Payer: MEDICARE | Attending: Registered" | Primary: Registered"

## 2018-10-22 DIAGNOSIS — Z6841 Body Mass Index (BMI) 40.0 and over, adult: Principal | ICD-10-CM

## 2018-11-21 ENCOUNTER — Ambulatory Visit: Admit: 2018-11-21 | Discharge: 2018-11-22 | Payer: MEDICARE

## 2019-01-12 ENCOUNTER — Encounter: Admit: 2019-01-12 | Discharge: 2019-01-13 | Payer: MEDICARE

## 2019-01-21 ENCOUNTER — Institutional Professional Consult (permissible substitution): Admit: 2019-01-21 | Discharge: 2019-01-22 | Payer: MEDICARE

## 2019-02-03 MED ORDER — OMEPRAZOLE 20 MG TABLET,DELAYED RELEASE
ORAL_TABLET | Freq: Every day | ORAL | 3 refills | 90 days | Status: CP
Start: 2019-02-03 — End: 2020-02-03

## 2019-02-03 MED ORDER — ALLOPURINOL 100 MG TABLET
Freq: Two times a day (BID) | ORAL | 3 refills | 90 days | Status: CP
Start: 2019-02-03 — End: ?

## 2019-02-10 MED ORDER — OLMESARTAN 20 MG TABLET
ORAL_TABLET | Freq: Every day | ORAL | 11 refills | 90.00000 days | Status: CP
Start: 2019-02-10 — End: ?

## 2019-02-11 ENCOUNTER — Encounter: Admit: 2019-02-11 | Discharge: 2019-02-12 | Payer: MEDICARE

## 2019-02-11 DIAGNOSIS — E119 Type 2 diabetes mellitus without complications: Principal | ICD-10-CM

## 2019-02-11 DIAGNOSIS — Z23 Encounter for immunization: Principal | ICD-10-CM

## 2019-02-11 DIAGNOSIS — R600 Localized edema: Principal | ICD-10-CM

## 2019-02-11 DIAGNOSIS — G43809 Other migraine, not intractable, without status migrainosus: Principal | ICD-10-CM

## 2019-02-11 MED ORDER — ELETRIPTAN 20 MG TABLET
ORAL_TABLET | Freq: Once | ORAL | 1 refills | 0 days | Status: CP | PRN
Start: 2019-02-11 — End: ?

## 2019-02-11 MED ORDER — CLOTRIMAZOLE-BETAMETHASONE 1 %-0.05 % TOPICAL CREAM
1 refills | 0 days | Status: CP
Start: 2019-02-11 — End: 2020-02-11

## 2019-02-11 MED ORDER — OLMESARTAN 20 MG TABLET
ORAL_TABLET | Freq: Every day | ORAL | 11 refills | 90.00000 days | Status: CP
Start: 2019-02-11 — End: ?

## 2019-02-11 MED ORDER — SHINGRIX (PF) 50 MCG/0.5 ML INTRAMUSCULAR SUSPENSION, KIT
Freq: Once | INTRAMUSCULAR | 1 refills | 1 days | Status: CP
Start: 2019-02-11 — End: 2019-02-11

## 2019-02-12 MED ORDER — OLMESARTAN 20 MG TABLET
Freq: Every day | ORAL | 0.00000 days
Start: 2019-02-12 — End: 2020-01-29

## 2019-02-24 ENCOUNTER — Institutional Professional Consult (permissible substitution): Admit: 2019-02-24 | Discharge: 2019-02-25 | Payer: MEDICARE | Attending: Registered" | Primary: Registered"

## 2019-03-05 ENCOUNTER — Encounter: Admit: 2019-03-05 | Discharge: 2019-03-06 | Payer: MEDICARE

## 2019-03-05 DIAGNOSIS — R234 Changes in skin texture: Principal | ICD-10-CM

## 2019-03-05 DIAGNOSIS — Z8349 Family history of other endocrine, nutritional and metabolic diseases: Principal | ICD-10-CM

## 2019-03-05 DIAGNOSIS — Z79899 Other long term (current) drug therapy: Principal | ICD-10-CM

## 2019-03-05 DIAGNOSIS — R600 Localized edema: Principal | ICD-10-CM

## 2019-03-05 DIAGNOSIS — B20 Human immunodeficiency virus [HIV] disease: Principal | ICD-10-CM

## 2019-03-05 MED ORDER — TRIAMCINOLONE ACETONIDE 0.1 % LOTION
Freq: Two times a day (BID) | TOPICAL | 1 refills | 0.00000 days | Status: CP
Start: 2019-03-05 — End: 2020-03-04

## 2019-03-05 MED ORDER — DESCOVY 200 MG-25 MG TABLET
ORAL_TABLET | Freq: Every day | ORAL | 11 refills | 30 days | Status: CP
Start: 2019-03-05 — End: ?

## 2019-03-05 MED ORDER — ELETRIPTAN 20 MG TABLET
ORAL_TABLET | Freq: Once | ORAL | 1 refills | 0 days | Status: CP | PRN
Start: 2019-03-05 — End: ?

## 2019-03-05 MED ORDER — NICOTINE (POLACRILEX) 2 MG BUCCAL MINI LOZENGE
11 refills | 0 days | Status: CP
Start: 2019-03-05 — End: ?

## 2019-03-05 MED ORDER — TIVICAY 50 MG TABLET
ORAL_TABLET | Freq: Every day | ORAL | 11 refills | 30 days | Status: CP
Start: 2019-03-05 — End: 2020-02-28

## 2019-03-06 ENCOUNTER — Encounter: Admit: 2019-03-06 | Discharge: 2019-03-06 | Disposition: A | Discharge status: Home / Self Care | Payer: MEDICARE

## 2019-03-06 ENCOUNTER — Emergency Department: Admit: 2019-03-06 | Discharge: 2019-03-06 | Disposition: A | Discharge status: Home / Self Care | Payer: MEDICARE

## 2019-03-06 DIAGNOSIS — M87052 Idiopathic aseptic necrosis of left femur: Principal | ICD-10-CM

## 2019-03-06 MED ORDER — LIDOCAINE 5 % TOPICAL OINTMENT
Freq: Three times a day (TID) | TOPICAL | 11 refills | 0 days | Status: CP
Start: 2019-03-06 — End: 2020-03-05

## 2019-03-06 MED ORDER — LIDOCAINE 5 % TOPICAL PATCH
MEDICATED_PATCH | TRANSDERMAL | 0 refills | 30.00000 days | Status: CP
Start: 2019-03-06 — End: 2019-04-05

## 2019-03-06 MED ORDER — PREDNISONE 20 MG TABLET
ORAL_TABLET | 0 refills | 0 days | Status: CP
Start: 2019-03-06 — End: ?

## 2019-03-09 ENCOUNTER — Encounter: Admit: 2019-03-09 | Discharge: 2019-03-10 | Payer: MEDICARE

## 2019-03-09 DIAGNOSIS — M87052 Idiopathic aseptic necrosis of left femur: Principal | ICD-10-CM

## 2019-03-09 MED ORDER — ALBUTEROL SULFATE HFA 90 MCG/ACTUATION AEROSOL INHALER
4 refills | 0 days | Status: CP
Start: 2019-03-09 — End: ?

## 2019-03-11 ENCOUNTER — Encounter: Admit: 2019-03-11 | Discharge: 2019-03-12 | Payer: MEDICARE | Attending: Internal Medicine | Primary: Internal Medicine

## 2019-03-11 DIAGNOSIS — Z720 Tobacco use: Principal | ICD-10-CM

## 2019-03-11 DIAGNOSIS — M87052 Idiopathic aseptic necrosis of left femur: Principal | ICD-10-CM

## 2019-03-11 DIAGNOSIS — M1612 Unilateral primary osteoarthritis, left hip: Principal | ICD-10-CM

## 2019-03-11 DIAGNOSIS — Z6841 Body Mass Index (BMI) 40.0 and over, adult: Principal | ICD-10-CM

## 2019-03-18 ENCOUNTER — Encounter: Admit: 2019-03-18 | Discharge: 2019-03-19 | Payer: MEDICARE | Attending: Internal Medicine | Primary: Internal Medicine

## 2019-03-18 DIAGNOSIS — M1612 Unilateral primary osteoarthritis, left hip: Principal | ICD-10-CM

## 2019-03-18 DIAGNOSIS — M87052 Idiopathic aseptic necrosis of left femur: Principal | ICD-10-CM

## 2019-03-19 ENCOUNTER — Ambulatory Visit: Admit: 2019-03-19 | Discharge: 2019-03-20 | Payer: MEDICARE

## 2019-03-19 DIAGNOSIS — Z1231 Encounter for screening mammogram for malignant neoplasm of breast: Principal | ICD-10-CM

## 2019-03-19 DIAGNOSIS — Z8739 Personal history of other diseases of the musculoskeletal system and connective tissue: Principal | ICD-10-CM

## 2019-03-19 DIAGNOSIS — R590 Localized enlarged lymph nodes: Principal | ICD-10-CM

## 2019-03-19 DIAGNOSIS — M87052 Idiopathic aseptic necrosis of left femur: Principal | ICD-10-CM

## 2019-03-19 DIAGNOSIS — Z1239 Encounter for other screening for malignant neoplasm of breast: Principal | ICD-10-CM

## 2019-03-19 DIAGNOSIS — Z1211 Encounter for screening for malignant neoplasm of colon: Principal | ICD-10-CM

## 2019-03-19 MED ORDER — MEDICAL SUPPLY ITEM
1 refills | 0 days | Status: CP
Start: 2019-03-19 — End: ?

## 2019-03-25 MED ORDER — CLOTRIMAZOLE-BETAMETHASONE 1 %-0.05 % TOPICAL CREAM
1 refills | 0 days | Status: CP
Start: 2019-03-25 — End: ?

## 2019-04-01 LAB — EXTERNAL GENERIC LAB PROCEDURE

## 2019-04-04 ENCOUNTER — Encounter: Admit: 2019-04-04 | Discharge: 2019-04-04 | Payer: MEDICARE

## 2019-04-08 ENCOUNTER — Ambulatory Visit: Admit: 2019-04-08 | Discharge: 2019-04-09 | Payer: MEDICARE

## 2019-04-20 ENCOUNTER — Telehealth: Admit: 2019-04-20 | Discharge: 2019-04-21 | Payer: MEDICARE

## 2019-04-20 MED ORDER — XTAMPZA ER 13.5 MG CAPSULE SPRINKLE
0.00000 days
Start: 2019-04-20 — End: ?

## 2019-04-20 MED ORDER — NICOTINE (POLACRILEX) 4 MG BUCCAL LOZENGE
LOZENGE | BUCCAL | 6 refills | 10.00000 days | Status: CP | PRN
Start: 2019-04-20 — End: ?

## 2019-04-22 DIAGNOSIS — B20 Human immunodeficiency virus [HIV] disease: Principal | ICD-10-CM

## 2019-04-22 MED ORDER — TIVICAY 50 MG TABLET
ORAL_TABLET | Freq: Every day | ORAL | 11 refills | 30 days | Status: CP
Start: 2019-04-22 — End: 2020-04-16

## 2019-04-22 MED ORDER — DESCOVY 200 MG-25 MG TABLET
ORAL_TABLET | Freq: Every day | ORAL | 11 refills | 30.00000 days | Status: CP
Start: 2019-04-22 — End: ?

## 2019-04-27 MED ORDER — DOXEPIN 50 MG CAPSULE
Freq: Every evening | ORAL | 0.00000 days
Start: 2019-04-27 — End: ?

## 2019-04-27 MED ORDER — PRAZOSIN 2 MG CAPSULE
0 days
Start: 2019-04-27 — End: ?

## 2019-04-28 MED ORDER — RELISTOR 150 MG TABLET
0.00000 days
Start: 2019-04-28 — End: ?

## 2019-05-07 ENCOUNTER — Encounter: Admit: 2019-05-07 | Discharge: 2019-05-08 | Payer: MEDICARE

## 2019-05-07 DIAGNOSIS — F17210 Nicotine dependence, cigarettes, uncomplicated: Principal | ICD-10-CM

## 2019-05-13 LAB — EXTERNAL GENERIC LAB PROCEDURE: COLOGUARD: NEGATIVE

## 2019-05-18 ENCOUNTER — Encounter
Admit: 2019-05-18 | Discharge: 2019-05-19 | Payer: MEDICARE | Attending: Student in an Organized Health Care Education/Training Program | Primary: Student in an Organized Health Care Education/Training Program

## 2019-05-18 MED ORDER — NICOTINE 21 MG/24 HR DAILY TRANSDERMAL PATCH
MEDICATED_PATCH | TRANSDERMAL | 0 refills | 28 days | Status: CP
Start: 2019-05-18 — End: ?

## 2019-05-18 MED ORDER — NICOTINE (POLACRILEX) 4 MG BUCCAL LOZENGE
BUCCAL | 0 refills | 5 days | Status: CP | PRN
Start: 2019-05-18 — End: 2019-06-17

## 2019-05-27 ENCOUNTER — Ambulatory Visit: Admit: 2019-05-27 | Discharge: 2019-05-28 | Payer: MEDICARE

## 2019-05-27 DIAGNOSIS — B20 Human immunodeficiency virus [HIV] disease: Principal | ICD-10-CM

## 2019-06-01 ENCOUNTER — Encounter
Admit: 2019-06-01 | Discharge: 2019-06-02 | Payer: MEDICARE | Attending: Student in an Organized Health Care Education/Training Program | Primary: Student in an Organized Health Care Education/Training Program

## 2019-06-04 ENCOUNTER — Ambulatory Visit: Admit: 2019-06-04 | Payer: MEDICARE | Attending: Registered" | Primary: Registered"

## 2019-06-09 MED ORDER — CLOTRIMAZOLE-BETAMETHASONE 1 %-0.05 % TOPICAL CREAM
1 refills | 0 days | Status: CP
Start: 2019-06-09 — End: ?

## 2019-06-11 ENCOUNTER — Encounter: Admit: 2019-06-11 | Payer: MEDICARE | Attending: Registered" | Primary: Registered"

## 2019-06-18 ENCOUNTER — Institutional Professional Consult (permissible substitution): Admit: 2019-06-18 | Discharge: 2019-06-18 | Payer: MEDICARE | Attending: Registered" | Primary: Registered"

## 2019-06-18 ENCOUNTER — Encounter: Admit: 2019-06-18 | Discharge: 2019-06-18 | Payer: MEDICARE

## 2019-07-16 NOTE — Unmapped (Signed)
This patient has been disenrolled from the [facility] specialty pharmacy services due to a pharmacy change. The patient is now filling at [facility].Marland Kitchen    Roderic Palau  [facility] Specialty Pharmacist

## 2019-07-16 NOTE — Unmapped (Signed)
[facility] Specialty Medication Onboarding    Specialty Medication: Descovy and Tivicay  Prior Authorization: Not Required   Financial Assistance: No - copay  <$25  Final Copay/Day Supply: $0 each / 30 days each    Insurance Restrictions: Yes - max 1 month supply     Notes to Pharmacist:     The triage team has completed the benefits investigation and has determined that the patient is able to fill this medication at [facility]. Please contact the patient to complete the onboarding or follow up with the prescribing physician as needed.

## 2019-07-16 NOTE — Unmapped (Signed)
I called to onboard patient to receive her Tivicay and Descovy from the [facility].  She stated she preferred to have them filled at [facility].    Corliss Skains. [place], [place].D.  Specialty Pharmacist  [facility]  [phone] option [phone]

## 2019-07-20 ENCOUNTER — Encounter: Admit: 2019-07-20 | Discharge: 2019-07-21 | Disposition: A | Discharge status: Home / Self Care | Payer: MEDICARE

## 2019-07-20 DIAGNOSIS — G8929 Other chronic pain: Secondary | ICD-10-CM

## 2019-07-20 DIAGNOSIS — M549 Dorsalgia, unspecified: Principal | ICD-10-CM

## 2019-07-20 DIAGNOSIS — M25552 Pain in left hip: Secondary | ICD-10-CM

## 2019-07-20 DIAGNOSIS — M25559 Pain in unspecified hip: Principal | ICD-10-CM

## 2019-07-22 MED ORDER — OXYCODONE-ACETAMINOPHEN 7.5 MG-325 MG TABLET
ORAL_TABLET | Freq: Four times a day (QID) | ORAL | 0 refills | 30.00000 days | Status: CP | PRN
Start: 2019-07-22 — End: ?

## 2019-07-28 ENCOUNTER — Ambulatory Visit: Admit: 2019-07-28 | Payer: MEDICARE

## 2019-07-28 DIAGNOSIS — R590 Localized enlarged lymph nodes: Principal | ICD-10-CM

## 2019-07-30 ENCOUNTER — Encounter: Admit: 2019-07-30 | Discharge: 2019-07-31 | Payer: MEDICARE

## 2019-07-30 DIAGNOSIS — F17200 Nicotine dependence, unspecified, uncomplicated: Principal | ICD-10-CM

## 2019-07-30 DIAGNOSIS — Z122 Encounter for screening for malignant neoplasm of respiratory organs: Principal | ICD-10-CM

## 2019-07-30 DIAGNOSIS — B20 Human immunodeficiency virus [HIV] disease: Principal | ICD-10-CM

## 2019-07-30 DIAGNOSIS — Z8739 Personal history of other diseases of the musculoskeletal system and connective tissue: Principal | ICD-10-CM

## 2019-07-30 DIAGNOSIS — M87052 Idiopathic aseptic necrosis of left femur: Principal | ICD-10-CM

## 2019-07-30 MED ORDER — CLOTRIMAZOLE-BETAMETHASONE 1 %-0.05 % TOPICAL CREAM
1 refills | 0 days
Start: 2019-07-30 — End: ?

## 2019-07-31 MED ORDER — CLOTRIMAZOLE-BETAMETHASONE 1 %-0.05 % TOPICAL CREAM
1 refills | 0 days | Status: CP
Start: 2019-07-31 — End: ?

## 2019-08-03 ENCOUNTER — Ambulatory Visit: Admit: 2019-08-03 | Discharge: 2019-08-04 | Payer: MEDICARE | Attending: Social Worker | Primary: Social Worker

## 2019-08-03 DIAGNOSIS — M87051 Idiopathic aseptic necrosis of right femur: Secondary | ICD-10-CM

## 2019-08-03 DIAGNOSIS — R590 Localized enlarged lymph nodes: Principal | ICD-10-CM

## 2019-08-03 DIAGNOSIS — M87052 Idiopathic aseptic necrosis of left femur: Secondary | ICD-10-CM

## 2019-08-03 DIAGNOSIS — M5416 Radiculopathy, lumbar region: Principal | ICD-10-CM

## 2019-08-03 DIAGNOSIS — Z9189 Other specified personal risk factors, not elsewhere classified: Principal | ICD-10-CM

## 2019-08-03 DIAGNOSIS — Z7189 Other specified counseling: Principal | ICD-10-CM

## 2019-08-03 DIAGNOSIS — E1140 Type 2 diabetes mellitus with diabetic neuropathy, unspecified: Principal | ICD-10-CM

## 2019-08-03 DIAGNOSIS — G629 Polyneuropathy, unspecified: Principal | ICD-10-CM

## 2019-08-03 DIAGNOSIS — Z0000 Encounter for general adult medical examination without abnormal findings: Principal | ICD-10-CM

## 2019-08-03 MED ORDER — AMITRIPTYLINE 25 MG TABLET
ORAL_TABLET | Freq: Every evening | ORAL | 3 refills | 90.00000 days | Status: CP
Start: 2019-08-03 — End: 2020-08-02

## 2019-08-03 MED ORDER — MORPHINE ER 15 MG TABLET,EXTENDED RELEASE
ORAL_TABLET | Freq: Two times a day (BID) | ORAL | 0 refills | 30 days | Status: CP
Start: 2019-08-03 — End: 2020-08-02

## 2019-08-18 MED ORDER — CYCLOBENZAPRINE 5 MG TABLET
0 days
Start: 2019-08-18 — End: ?

## 2019-08-27 ENCOUNTER — Encounter: Admit: 2019-08-27 | Discharge: 2019-08-28 | Payer: MEDICARE

## 2019-08-27 DIAGNOSIS — M87052 Idiopathic aseptic necrosis of left femur: Principal | ICD-10-CM

## 2019-08-27 DIAGNOSIS — M87051 Idiopathic aseptic necrosis of right femur: Principal | ICD-10-CM

## 2019-08-27 DIAGNOSIS — M5416 Radiculopathy, lumbar region: Principal | ICD-10-CM

## 2019-08-27 MED ORDER — OXYCODONE-ACETAMINOPHEN 7.5 MG-325 MG TABLET
ORAL_TABLET | Freq: Four times a day (QID) | ORAL | 0 refills | 23.00000 days | Status: CP | PRN
Start: 2019-08-27 — End: 2020-08-26

## 2019-08-27 MED ORDER — MORPHINE ER 15 MG TABLET,EXTENDED RELEASE
ORAL_TABLET | Freq: Three times a day (TID) | ORAL | 0 refills | 30 days | Status: CP
Start: 2019-08-27 — End: 2020-08-26

## 2019-09-17 MED ORDER — CLOTRIMAZOLE-BETAMETHASONE 1 %-0.05 % TOPICAL CREAM
1 refills | 0 days | Status: CP
Start: 2019-09-17 — End: ?

## 2019-09-28 MED ORDER — OXYCODONE-ACETAMINOPHEN 7.5 MG-325 MG TABLET
ORAL_TABLET | Freq: Four times a day (QID) | ORAL | 0 refills | 23.00000 days | Status: CN | PRN
Start: 2019-09-28 — End: 2020-09-27

## 2019-09-28 MED ORDER — MORPHINE ER 15 MG TABLET,EXTENDED RELEASE
ORAL_TABLET | Freq: Two times a day (BID) | ORAL | 0 refills | 30.00000 days | Status: CP
Start: 2019-09-28 — End: 2020-09-27

## 2019-10-12 DIAGNOSIS — R600 Localized edema: Principal | ICD-10-CM

## 2019-10-12 MED ORDER — FUROSEMIDE 20 MG TABLET
ORAL_TABLET | 3 refills | 0 days | Status: CP
Start: 2019-10-12 — End: ?

## 2019-10-15 MED ORDER — DOXEPIN 50 MG CAPSULE
Freq: Every evening | ORAL | 0.00000 days
Start: 2019-10-15 — End: 2019-10-30

## 2019-10-15 MED ORDER — PRAZOSIN 2 MG CAPSULE
0 days
Start: 2019-10-15 — End: ?

## 2019-10-19 MED ORDER — ESZOPICLONE 3 MG TABLET
Freq: Every evening | ORAL | 0.00000 days
Start: 2019-10-19 — End: 2019-10-30

## 2019-10-19 MED ORDER — TRAZODONE 150 MG TABLET
0.00000 days
Start: 2019-10-19 — End: ?

## 2019-10-19 MED ORDER — OMEPRAZOLE 20 MG CAPSULE,DELAYED RELEASE
ORAL_CAPSULE | 3 refills | 0 days | Status: CP
Start: 2019-10-19 — End: ?

## 2019-10-19 MED ORDER — ALLOPURINOL 100 MG TABLET
ORAL_TABLET | 3 refills | 0 days | Status: CP
Start: 2019-10-19 — End: ?

## 2019-10-26 MED ORDER — OXYCODONE-ACETAMINOPHEN 7.5 MG-325 MG TABLET
ORAL_TABLET | Freq: Four times a day (QID) | ORAL | 0 refills | 23 days | PRN
Start: 2019-10-26 — End: 2020-10-25

## 2019-10-26 MED ORDER — MORPHINE ER 15 MG TABLET,EXTENDED RELEASE
ORAL_TABLET | Freq: Three times a day (TID) | ORAL | 0 refills | 30.00000 days
Start: 2019-10-26 — End: 2020-10-25

## 2019-10-27 MED ORDER — OXYCODONE-ACETAMINOPHEN 7.5 MG-325 MG TABLET
ORAL_TABLET | Freq: Four times a day (QID) | ORAL | 0 refills | 23 days | Status: CP | PRN
Start: 2019-10-27 — End: 2020-10-26

## 2019-10-30 ENCOUNTER — Encounter: Admit: 2019-10-30 | Discharge: 2019-10-31 | Payer: MEDICARE | Attending: Internal Medicine | Primary: Internal Medicine

## 2019-10-30 ENCOUNTER — Ambulatory Visit: Admit: 2019-10-30 | Discharge: 2019-10-30 | Discharge status: Against Medical Advice | Payer: MEDICARE

## 2019-10-30 DIAGNOSIS — M79671 Pain in right foot: Principal | ICD-10-CM

## 2019-10-30 DIAGNOSIS — M79672 Pain in left foot: Secondary | ICD-10-CM

## 2019-10-30 DIAGNOSIS — G6289 Other specified polyneuropathies: Principal | ICD-10-CM

## 2019-10-30 MED ORDER — GABAPENTIN 100 MG CAPSULE
ORAL_CAPSULE | Freq: Every evening | ORAL | 1 refills | 30 days | Status: CP
Start: 2019-10-30 — End: 2019-12-29

## 2019-11-02 ENCOUNTER — Ambulatory Visit
Admit: 2019-11-02 | Discharge: 2019-11-03 | Payer: MEDICARE | Attending: Physical Medicine & Rehabilitation | Primary: Physical Medicine & Rehabilitation

## 2019-11-02 ENCOUNTER — Ambulatory Visit: Admit: 2019-11-02 | Discharge: 2019-11-02 | Payer: MEDICARE

## 2019-11-02 DIAGNOSIS — G8929 Other chronic pain: Secondary | ICD-10-CM

## 2019-11-02 DIAGNOSIS — M546 Pain in thoracic spine: Principal | ICD-10-CM

## 2019-11-02 DIAGNOSIS — M549 Dorsalgia, unspecified: Principal | ICD-10-CM

## 2019-11-02 DIAGNOSIS — R202 Paresthesia of skin: Principal | ICD-10-CM

## 2019-11-02 DIAGNOSIS — M545 Lumbar back pain: Principal | ICD-10-CM

## 2019-11-02 MED ORDER — AMITRIPTYLINE 25 MG TABLET
Freq: Every evening | ORAL | 0.00000 days
Start: 2019-11-02 — End: ?

## 2019-11-05 ENCOUNTER — Ambulatory Visit: Admit: 2019-11-05 | Discharge: 2019-11-06 | Payer: MEDICARE

## 2019-11-05 DIAGNOSIS — M545 Chronic low back pain, unspecified back pain laterality, unspecified whether sciatica present: Principal | ICD-10-CM

## 2019-11-05 DIAGNOSIS — G8929 Other chronic pain: Principal | ICD-10-CM

## 2019-11-05 DIAGNOSIS — I10 Essential (primary) hypertension: Principal | ICD-10-CM

## 2019-11-12 MED ORDER — DOXEPIN 50 MG CAPSULE
Freq: Every evening | ORAL | 0.00000 days
Start: 2019-11-12 — End: 2019-12-09

## 2019-11-12 MED ORDER — ELETRIPTAN 20 MG TABLET
ORAL_TABLET | 1 refills | 0 days | Status: CP
Start: 2019-11-12 — End: ?

## 2019-11-17 MED ORDER — ESZOPICLONE 3 MG TABLET
Freq: Every evening | ORAL | 0 days
Start: 2019-11-17 — End: 2019-12-09

## 2019-11-24 MED ORDER — MORPHINE ER 15 MG TABLET,EXTENDED RELEASE
ORAL_TABLET | Freq: Three times a day (TID) | ORAL | 0 refills | 30.00000 days | Status: CP
Start: 2019-11-24 — End: 2020-11-23

## 2019-11-25 MED ORDER — OXYCODONE-ACETAMINOPHEN 7.5 MG-325 MG TABLET
ORAL_TABLET | Freq: Four times a day (QID) | ORAL | 0 refills | 0.00000 days | Status: CP | PRN
Start: 2019-11-25 — End: 2019-12-17

## 2019-11-30 ENCOUNTER — Encounter: Admit: 2019-11-30 | Discharge: 2019-12-01 | Payer: MEDICARE

## 2019-11-30 DIAGNOSIS — M87051 Idiopathic aseptic necrosis of right femur: Principal | ICD-10-CM

## 2019-11-30 DIAGNOSIS — F319 Bipolar disorder, unspecified: Principal | ICD-10-CM

## 2019-11-30 DIAGNOSIS — M87052 Idiopathic aseptic necrosis of left femur: Principal | ICD-10-CM

## 2019-11-30 DIAGNOSIS — G4733 Obstructive sleep apnea (adult) (pediatric): Principal | ICD-10-CM

## 2019-11-30 DIAGNOSIS — G8929 Other chronic pain: Principal | ICD-10-CM

## 2019-11-30 DIAGNOSIS — F32A Depression, unspecified depression type: Principal | ICD-10-CM

## 2019-11-30 DIAGNOSIS — I10 Essential (primary) hypertension: Principal | ICD-10-CM

## 2019-11-30 DIAGNOSIS — M545 Chronic low back pain, unspecified back pain laterality, unspecified whether sciatica present: Principal | ICD-10-CM

## 2019-11-30 DIAGNOSIS — M109 Gout, unspecified: Principal | ICD-10-CM

## 2019-11-30 MED ORDER — SEMAGLUTIDE 0.25 MG OR 0.5 MG (2 MG/1.5 ML) SUBCUTANEOUS PEN INJECTOR
SUBCUTANEOUS | 0 refills | 112 days | Status: CP
Start: 2019-11-30 — End: 2019-11-30

## 2019-11-30 MED ORDER — METHYLNALTREXONE 150 MG TABLET
ORAL_TABLET | Freq: Every day | ORAL | 3 refills | 90 days | Status: CP
Start: 2019-11-30 — End: 2020-01-26

## 2019-11-30 MED ORDER — WEGOVY 0.25 MG/0.5 ML SUBCUTANEOUS PEN INJECTOR
SUBCUTANEOUS | 0 refills | 0 days | Status: CP
Start: 2019-11-30 — End: 2020-01-14

## 2019-11-30 MED ORDER — PREGABALIN 50 MG CAPSULE
ORAL_CAPSULE | Freq: Two times a day (BID) | ORAL | 1 refills | 90.00000 days | Status: CP
Start: 2019-11-30 — End: 2020-11-29

## 2019-12-01 ENCOUNTER — Encounter: Admit: 2019-12-01 | Discharge: 2019-12-02 | Payer: MEDICARE

## 2019-12-01 DIAGNOSIS — R202 Paresthesia of skin: Principal | ICD-10-CM

## 2019-12-09 ENCOUNTER — Ambulatory Visit
Admit: 2019-12-09 | Discharge: 2019-12-10 | Payer: MEDICARE | Attending: Physical Medicine & Rehabilitation | Primary: Physical Medicine & Rehabilitation

## 2019-12-09 DIAGNOSIS — G5622 Lesion of ulnar nerve, left upper limb: Principal | ICD-10-CM

## 2019-12-09 DIAGNOSIS — R269 Unspecified abnormalities of gait and mobility: Principal | ICD-10-CM

## 2019-12-09 DIAGNOSIS — Z6841 Body Mass Index (BMI) 40.0 and over, adult: Principal | ICD-10-CM

## 2019-12-11 MED ORDER — DOXEPIN 50 MG CAPSULE
Freq: Every evening | ORAL | 0 days
Start: 2019-12-11 — End: 2020-01-14

## 2019-12-11 MED ORDER — PRAZOSIN 2 MG CAPSULE
0.00000 days
Start: 2019-12-11 — End: 2020-01-14

## 2019-12-16 ENCOUNTER — Encounter: Admit: 2019-12-16 | Discharge: 2019-12-17 | Payer: MEDICARE

## 2019-12-16 DIAGNOSIS — B20 Human immunodeficiency virus [HIV] disease: Principal | ICD-10-CM

## 2019-12-16 DIAGNOSIS — G4733 Obstructive sleep apnea (adult) (pediatric): Principal | ICD-10-CM

## 2019-12-16 DIAGNOSIS — Z7289 Other problems related to lifestyle: Principal | ICD-10-CM

## 2019-12-16 DIAGNOSIS — Z122 Encounter for screening for malignant neoplasm of respiratory organs: Principal | ICD-10-CM

## 2019-12-16 MED ORDER — BICTEGRAVIR 50 MG-EMTRICITABINE 200 MG-TENOFOVIR ALAFENAM 25 MG TABLET
ORAL_TABLET | Freq: Every day | ORAL | 11 refills | 30.00000 days | Status: CP
Start: 2019-12-16 — End: 2020-01-26

## 2019-12-17 MED ORDER — OXYCODONE-ACETAMINOPHEN 7.5 MG-325 MG TABLET
ORAL_TABLET | Freq: Three times a day (TID) | ORAL | 0 refills | 0.00000 days | Status: CP | PRN
Start: 2019-12-17 — End: 2020-01-26

## 2019-12-17 MED ORDER — MORPHINE ER 15 MG TABLET,EXTENDED RELEASE: tablet | 0 refills | 0 days | Status: AC

## 2019-12-17 MED ORDER — MORPHINE ER 15 MG TABLET,EXTENDED RELEASE
ORAL_TABLET | Freq: Three times a day (TID) | ORAL | 0 refills | 30.00000 days | Status: CN
Start: 2019-12-17 — End: 2020-01-26

## 2020-01-12 MED ORDER — ALLOPURINOL 100 MG TABLET
0.00000 days
Start: 2020-01-12 — End: 2020-01-18

## 2020-01-13 MED ORDER — TRAZODONE 150 MG TABLET
0 days
Start: 2020-01-13 — End: 2020-01-26

## 2020-01-14 ENCOUNTER — Encounter: Admit: 2020-01-14 | Discharge: 2020-01-15 | Payer: MEDICARE

## 2020-01-14 DIAGNOSIS — I10 Essential (primary) hypertension: Principal | ICD-10-CM

## 2020-01-14 DIAGNOSIS — Z6841 Body Mass Index (BMI) 40.0 and over, adult: Principal | ICD-10-CM

## 2020-01-14 DIAGNOSIS — F17200 Nicotine dependence, unspecified, uncomplicated: Principal | ICD-10-CM

## 2020-01-14 DIAGNOSIS — G4733 Obstructive sleep apnea (adult) (pediatric): Principal | ICD-10-CM

## 2020-01-16 MED ORDER — ALLOPURINOL 100 MG TABLET
ORAL_TABLET | 3 refills | 0 days
Start: 2020-01-16 — End: ?

## 2020-01-18 ENCOUNTER — Encounter
Admit: 2020-01-18 | Discharge: 2020-02-16 | Payer: MEDICARE | Attending: Student in an Organized Health Care Education/Training Program | Primary: Student in an Organized Health Care Education/Training Program

## 2020-01-18 ENCOUNTER — Ambulatory Visit
Admit: 2020-01-18 | Discharge: 2020-02-16 | Payer: MEDICARE | Attending: Student in an Organized Health Care Education/Training Program | Primary: Student in an Organized Health Care Education/Training Program

## 2020-01-18 DIAGNOSIS — M25552 Pain in left hip: Principal | ICD-10-CM

## 2020-01-18 DIAGNOSIS — R2689 Other abnormalities of gait and mobility: Principal | ICD-10-CM

## 2020-01-18 DIAGNOSIS — R269 Unspecified abnormalities of gait and mobility: Principal | ICD-10-CM

## 2020-01-18 DIAGNOSIS — G8929 Other chronic pain: Principal | ICD-10-CM

## 2020-01-18 DIAGNOSIS — Z7409 Other reduced mobility: Principal | ICD-10-CM

## 2020-01-18 DIAGNOSIS — Z9181 History of falling: Principal | ICD-10-CM

## 2020-01-18 MED ORDER — ALLOPURINOL 100 MG TABLET
ORAL_TABLET | ORAL | 3 refills | 0.00000 days | Status: CP
Start: 2020-01-18 — End: 2020-01-26

## 2020-01-19 MED ORDER — METFORMIN 1,000 MG TABLET
ORAL_TABLET | 0 days
Start: 2020-01-19 — End: ?

## 2020-01-26 ENCOUNTER — Encounter: Admit: 2020-01-26 | Discharge: 2020-01-27 | Payer: MEDICARE

## 2020-01-26 DIAGNOSIS — M87052 Idiopathic aseptic necrosis of left femur: Principal | ICD-10-CM

## 2020-01-26 DIAGNOSIS — N83201 Unspecified ovarian cyst, right side: Principal | ICD-10-CM

## 2020-01-26 DIAGNOSIS — Z01419 Encounter for gynecological examination (general) (routine) without abnormal findings: Principal | ICD-10-CM

## 2020-01-26 DIAGNOSIS — M87051 Idiopathic aseptic necrosis of right femur: Principal | ICD-10-CM

## 2020-01-26 DIAGNOSIS — N83202 Unspecified ovarian cyst, left side: Principal | ICD-10-CM

## 2020-01-26 DIAGNOSIS — B20 Human immunodeficiency virus [HIV] disease: Principal | ICD-10-CM

## 2020-01-26 DIAGNOSIS — G43809 Other migraine, not intractable, without status migrainosus: Principal | ICD-10-CM

## 2020-01-26 DIAGNOSIS — M549 Dorsalgia, unspecified: Principal | ICD-10-CM

## 2020-01-26 DIAGNOSIS — F319 Bipolar disorder, unspecified: Principal | ICD-10-CM

## 2020-01-26 DIAGNOSIS — R790 Abnormal level of blood mineral: Principal | ICD-10-CM

## 2020-01-26 DIAGNOSIS — E1140 Type 2 diabetes mellitus with diabetic neuropathy, unspecified: Principal | ICD-10-CM

## 2020-01-26 DIAGNOSIS — R2689 Other abnormalities of gait and mobility: Principal | ICD-10-CM

## 2020-01-26 MED ORDER — BICTEGRAVIR 50 MG-EMTRICITABINE 200 MG-TENOFOVIR ALAFENAM 25 MG TABLET
ORAL_TABLET | Freq: Every day | ORAL | 11 refills | 30 days | Status: CP
Start: 2020-01-26 — End: 2020-02-04

## 2020-01-26 MED ORDER — ALLOPURINOL 300 MG TABLET
ORAL_TABLET | Freq: Every day | ORAL | 3 refills | 90 days | Status: CP
Start: 2020-01-26 — End: 2020-01-29

## 2020-01-26 MED ORDER — LIDOCAINE 5 % TOPICAL PATCH
MEDICATED_PATCH | Freq: Two times a day (BID) | TRANSDERMAL | 0 refills | 5.00000 days | Status: CP
Start: 2020-01-26 — End: 2021-01-25

## 2020-01-26 MED ORDER — MEDICAL SUPPLY ITEM
0 refills | 0 days | Status: CP
Start: 2020-01-26 — End: ?

## 2020-01-26 MED ORDER — MORPHINE ER 15 MG TABLET,EXTENDED RELEASE
ORAL_TABLET | Freq: Three times a day (TID) | ORAL | 0 refills | 0.00000 days | Status: CP
Start: 2020-01-26 — End: 2020-02-25

## 2020-01-26 MED ORDER — OXYCODONE-ACETAMINOPHEN 7.5 MG-325 MG TABLET
ORAL_TABLET | ORAL | 0 refills | 0.00000 days | Status: CP
Start: 2020-01-26 — End: 2020-02-25

## 2020-01-29 DIAGNOSIS — R600 Localized edema: Principal | ICD-10-CM

## 2020-01-29 MED ORDER — ALLOPURINOL 300 MG TABLET
ORAL_TABLET | Freq: Every day | ORAL | 3 refills | 90 days
Start: 2020-01-29 — End: ?

## 2020-01-29 MED ORDER — PREGABALIN 50 MG CAPSULE
ORAL_CAPSULE | Freq: Two times a day (BID) | ORAL | 1 refills | 90 days
Start: 2020-01-29 — End: 2021-01-28

## 2020-01-29 MED ORDER — METHYLNALTREXONE 150 MG TABLET
ORAL_TABLET | Freq: Every day | ORAL | 3 refills | 90 days
Start: 2020-01-29 — End: ?

## 2020-01-29 MED ORDER — OMEPRAZOLE 20 MG CAPSULE,DELAYED RELEASE
ORAL_CAPSULE | Freq: Every day | ORAL | 3 refills | 90 days
Start: 2020-01-29 — End: ?

## 2020-01-29 MED ORDER — OLOPATADINE 0.1 % EYE DROPS
Freq: Two times a day (BID) | OPHTHALMIC | 3 refills | 50 days
Start: 2020-01-29 — End: ?

## 2020-01-29 MED ORDER — CYCLOBENZAPRINE 5 MG TABLET
0 refills | 0 days
Start: 2020-01-29 — End: ?

## 2020-01-29 MED ORDER — CLOTRIMAZOLE-BETAMETHASONE 1 %-0.05 % TOPICAL CREAM
1 refills | 0 days
Start: 2020-01-29 — End: ?

## 2020-01-29 MED ORDER — LIDOCAINE 5 % TOPICAL PATCH
MEDICATED_PATCH | Freq: Two times a day (BID) | TRANSDERMAL | 0 refills | 5.00000 days
Start: 2020-01-29 — End: 2021-01-28

## 2020-01-29 MED ORDER — OLMESARTAN 20 MG TABLET
ORAL_TABLET | Freq: Every day | ORAL | 0 refills | 90 days
Start: 2020-01-29 — End: ?

## 2020-02-01 DIAGNOSIS — Z9181 History of falling: Principal | ICD-10-CM

## 2020-02-01 MED ORDER — MISCELLANEOUS MEDICAL SUPPLY MISC
0 refills | 0 days | Status: CP
Start: 2020-02-01 — End: ?

## 2020-02-02 MED ORDER — OLMESARTAN 20 MG TABLET
ORAL_TABLET | Freq: Every day | ORAL | 0 refills | 90 days | Status: CP
Start: 2020-02-02 — End: ?

## 2020-02-02 MED ORDER — LIDOCAINE 5 % TOPICAL PATCH
MEDICATED_PATCH | Freq: Two times a day (BID) | TRANSDERMAL | 0 refills | 5 days | Status: CP
Start: 2020-02-02 — End: 2021-02-01

## 2020-02-02 MED ORDER — ALLOPURINOL 300 MG TABLET
ORAL_TABLET | Freq: Every day | ORAL | 3 refills | 90 days | Status: CP
Start: 2020-02-02 — End: ?

## 2020-02-02 MED ORDER — CLOTRIMAZOLE-BETAMETHASONE 1 %-0.05 % TOPICAL CREAM
1 refills | 0 days | Status: CP
Start: 2020-02-02 — End: ?

## 2020-02-02 MED ORDER — OLOPATADINE 0.1 % EYE DROPS
Freq: Two times a day (BID) | OPHTHALMIC | 3 refills | 50 days | Status: CP
Start: 2020-02-02 — End: ?

## 2020-02-02 MED ORDER — OMEPRAZOLE 20 MG CAPSULE,DELAYED RELEASE
ORAL_CAPSULE | Freq: Every day | ORAL | 3 refills | 90 days | Status: CP
Start: 2020-02-02 — End: ?

## 2020-02-02 MED ORDER — FUROSEMIDE 20 MG TABLET
ORAL_TABLET | Freq: Every day | ORAL | 3 refills | 90 days | Status: CP
Start: 2020-02-02 — End: ?

## 2020-02-02 MED ORDER — METHYLNALTREXONE 150 MG TABLET
ORAL_TABLET | Freq: Every day | ORAL | 3 refills | 90.00000 days
Start: 2020-02-02 — End: ?

## 2020-02-04 DIAGNOSIS — B20 Human immunodeficiency virus [HIV] disease: Principal | ICD-10-CM

## 2020-02-04 MED ORDER — BICTEGRAVIR 50 MG-EMTRICITABINE 200 MG-TENOFOVIR ALAFENAM 25 MG TABLET
ORAL_TABLET | Freq: Every day | ORAL | 11 refills | 30 days | Status: CP
Start: 2020-02-04 — End: ?

## 2020-02-09 MED ORDER — ZIPRASIDONE 40 MG CAPSULE
0 days
Start: 2020-02-09 — End: 2020-02-19

## 2020-02-11 ENCOUNTER — Encounter: Admit: 2020-02-11 | Discharge: 2020-02-12 | Payer: MEDICARE | Attending: Registered" | Primary: Registered"

## 2020-02-11 DIAGNOSIS — R634 Abnormal weight loss: Principal | ICD-10-CM

## 2020-02-11 DIAGNOSIS — Z6841 Body Mass Index (BMI) 40.0 and over, adult: Principal | ICD-10-CM

## 2020-02-11 DIAGNOSIS — E1140 Type 2 diabetes mellitus with diabetic neuropathy, unspecified: Principal | ICD-10-CM

## 2020-02-19 ENCOUNTER — Encounter: Admit: 2020-02-19 | Discharge: 2020-02-20 | Payer: MEDICARE

## 2020-02-19 DIAGNOSIS — R110 Nausea: Principal | ICD-10-CM

## 2020-02-19 DIAGNOSIS — Z01419 Encounter for gynecological examination (general) (routine) without abnormal findings: Principal | ICD-10-CM

## 2020-02-19 DIAGNOSIS — N83201 Unspecified ovarian cyst, right side: Principal | ICD-10-CM

## 2020-02-19 DIAGNOSIS — N83202 Unspecified ovarian cyst, left side: Principal | ICD-10-CM

## 2020-02-19 MED ORDER — ONDANSETRON HCL 4 MG TABLET
ORAL_TABLET | Freq: Three times a day (TID) | ORAL | 1 refills | 30 days | Status: CP | PRN
Start: 2020-02-19 — End: 2021-02-18

## 2020-02-25 DIAGNOSIS — R52 Pain, unspecified: Principal | ICD-10-CM

## 2020-02-25 DIAGNOSIS — M545 Chronic low back pain, unspecified back pain laterality, unspecified whether sciatica present: Principal | ICD-10-CM

## 2020-02-25 DIAGNOSIS — G8929 Other chronic pain: Principal | ICD-10-CM

## 2020-02-25 MED ORDER — MORPHINE ER 15 MG TABLET,EXTENDED RELEASE
ORAL_TABLET | Freq: Three times a day (TID) | ORAL | 0 refills | 30 days | Status: CP
Start: 2020-02-25 — End: ?

## 2020-02-25 MED ORDER — OXYCODONE-ACETAMINOPHEN 7.5 MG-325 MG TABLET
ORAL_TABLET | 0 refills | 0 days | Status: CP
Start: 2020-02-25 — End: ?

## 2020-03-09 ENCOUNTER — Encounter
Admit: 2020-03-09 | Discharge: 2020-03-10 | Payer: MEDICARE | Attending: Physician Assistant | Primary: Physician Assistant

## 2020-03-14 ENCOUNTER — Encounter: Admit: 2020-03-14 | Discharge: 2020-03-17 | Payer: MEDICARE

## 2020-03-21 DIAGNOSIS — R52 Pain, unspecified: Principal | ICD-10-CM

## 2020-03-21 DIAGNOSIS — M545 Chronic low back pain, unspecified back pain laterality, unspecified whether sciatica present: Principal | ICD-10-CM

## 2020-03-21 DIAGNOSIS — G8929 Other chronic pain: Principal | ICD-10-CM

## 2020-03-21 MED ORDER — MORPHINE ER 15 MG TABLET,EXTENDED RELEASE
ORAL_TABLET | 0 refills | 0 days
Start: 2020-03-21 — End: ?

## 2020-03-21 MED ORDER — OXYCODONE-ACETAMINOPHEN 7.5 MG-325 MG TABLET
ORAL_TABLET | 0 refills | 0 days
Start: 2020-03-21 — End: ?

## 2020-03-22 DIAGNOSIS — R52 Pain, unspecified: Principal | ICD-10-CM

## 2020-03-22 DIAGNOSIS — G8929 Other chronic pain: Principal | ICD-10-CM

## 2020-03-22 DIAGNOSIS — M545 Chronic low back pain, unspecified back pain laterality, unspecified whether sciatica present: Principal | ICD-10-CM

## 2020-03-22 MED ORDER — OXYCODONE-ACETAMINOPHEN 7.5 MG-325 MG TABLET
ORAL_TABLET | 0 refills | 0 days | Status: CP
Start: 2020-03-22 — End: ?

## 2020-03-22 MED ORDER — PREGABALIN 50 MG CAPSULE
ORAL_CAPSULE | Freq: Two times a day (BID) | ORAL | 1 refills | 90.00000 days | Status: CN
Start: 2020-03-22 — End: 2021-03-22

## 2020-03-22 MED ORDER — MORPHINE ER 15 MG TABLET,EXTENDED RELEASE
ORAL_TABLET | Freq: Three times a day (TID) | ORAL | 0 refills | 30.00000 days | Status: CP
Start: 2020-03-22 — End: ?

## 2020-04-12 ENCOUNTER — Encounter: Admit: 2020-04-12 | Discharge: 2020-04-13 | Payer: MEDICARE | Attending: Registered" | Primary: Registered"

## 2020-04-12 MED ORDER — PREGABALIN 50 MG CAPSULE
ORAL_CAPSULE | 1 refills | 0 days | Status: CP
Start: 2020-04-12 — End: ?

## 2020-04-15 ENCOUNTER — Ambulatory Visit: Admit: 2020-04-15 | Discharge: 2020-04-16 | Payer: MEDICARE

## 2020-04-15 DIAGNOSIS — D509 Iron deficiency anemia, unspecified: Principal | ICD-10-CM

## 2020-04-15 DIAGNOSIS — R790 Abnormal level of blood mineral: Principal | ICD-10-CM

## 2020-04-18 MED ORDER — CYCLOBENZAPRINE 5 MG TABLET
ORAL_TABLET | 1 refills | 0 days | Status: CP
Start: 2020-04-18 — End: ?

## 2020-04-18 MED ORDER — METHYLNALTREXONE 150 MG TABLET
ORAL_TABLET | Freq: Every day | ORAL | 3 refills | 90 days
Start: 2020-04-18 — End: ?

## 2020-04-18 MED ORDER — OLMESARTAN 20 MG TABLET
ORAL_TABLET | Freq: Every day | ORAL | 0 refills | 90 days | Status: CP
Start: 2020-04-18 — End: ?

## 2020-04-20 MED ORDER — MORPHINE ER 15 MG TABLET,EXTENDED RELEASE
ORAL_TABLET | 0 refills | 0 days | Status: CP
Start: 2020-04-20 — End: ?

## 2020-04-20 MED ORDER — OXYCODONE-ACETAMINOPHEN 7.5 MG-325 MG TABLET
ORAL_TABLET | 0 refills | 0 days | Status: CP
Start: 2020-04-20 — End: ?

## 2020-04-25 MED ORDER — METHYLNALTREXONE 150 MG TABLET
ORAL_TABLET | Freq: Every day | ORAL | 3 refills | 90 days | Status: CP
Start: 2020-04-25 — End: ?

## 2020-05-12 ENCOUNTER — Encounter
Admit: 2020-05-12 | Discharge: 2020-05-13 | Payer: MEDICARE | Attending: Physician Assistant | Primary: Physician Assistant

## 2020-05-12 DIAGNOSIS — Z6841 Body Mass Index (BMI) 40.0 and over, adult: Principal | ICD-10-CM

## 2020-05-16 ENCOUNTER — Ambulatory Visit: Admit: 2020-05-16 | Payer: MEDICARE | Attending: Obstetrics & Gynecology | Primary: Obstetrics & Gynecology

## 2020-05-18 DIAGNOSIS — R52 Pain, unspecified: Principal | ICD-10-CM

## 2020-05-18 DIAGNOSIS — M545 Chronic low back pain, unspecified back pain laterality, unspecified whether sciatica present: Principal | ICD-10-CM

## 2020-05-18 DIAGNOSIS — G8929 Other chronic pain: Principal | ICD-10-CM

## 2020-05-18 MED ORDER — OXYCODONE-ACETAMINOPHEN 7.5 MG-325 MG TABLET
ORAL_TABLET | 0 refills | 0 days
Start: 2020-05-18 — End: ?

## 2020-05-18 MED ORDER — MORPHINE ER 15 MG TABLET,EXTENDED RELEASE
ORAL_TABLET | Freq: Three times a day (TID) | ORAL | 0 refills | 30 days
Start: 2020-05-18 — End: ?

## 2020-05-19 MED ORDER — OXYCODONE-ACETAMINOPHEN 7.5 MG-325 MG TABLET
ORAL_TABLET | 0 refills | 0 days | Status: CP
Start: 2020-05-19 — End: ?

## 2020-05-19 MED ORDER — MORPHINE ER 15 MG TABLET,EXTENDED RELEASE
ORAL_TABLET | Freq: Three times a day (TID) | ORAL | 0 refills | 30 days | Status: CP
Start: 2020-05-19 — End: ?

## 2020-05-25 ENCOUNTER — Ambulatory Visit: Admit: 2020-05-25 | Payer: MEDICARE

## 2020-05-31 ENCOUNTER — Ambulatory Visit: Admit: 2020-05-31 | Payer: MEDICARE

## 2020-06-10 MED ORDER — OXYCODONE-ACETAMINOPHEN 7.5 MG-325 MG TABLET
ORAL_TABLET | 0 refills | 0 days
Start: 2020-06-10 — End: ?

## 2020-06-13 MED ORDER — MORPHINE ER 15 MG TABLET,EXTENDED RELEASE
ORAL_TABLET | 0 refills | 0 days | Status: CP
Start: 2020-06-13 — End: ?

## 2020-06-13 MED ORDER — OXYCODONE-ACETAMINOPHEN 7.5 MG-325 MG TABLET
ORAL_TABLET | 0 refills | 0 days | Status: CP
Start: 2020-06-13 — End: ?

## 2020-06-24 ENCOUNTER — Encounter: Admit: 2020-06-24 | Discharge: 2020-06-25 | Payer: MEDICARE

## 2020-06-24 DIAGNOSIS — M545 Chronic low back pain, unspecified back pain laterality, unspecified whether sciatica present: Principal | ICD-10-CM

## 2020-06-24 DIAGNOSIS — M87052 Idiopathic aseptic necrosis of left femur: Principal | ICD-10-CM

## 2020-06-24 DIAGNOSIS — M87051 Idiopathic aseptic necrosis of right femur: Principal | ICD-10-CM

## 2020-06-24 DIAGNOSIS — D509 Iron deficiency anemia, unspecified: Principal | ICD-10-CM

## 2020-06-24 DIAGNOSIS — G8929 Other chronic pain: Principal | ICD-10-CM

## 2020-07-12 DIAGNOSIS — G8929 Other chronic pain: Principal | ICD-10-CM

## 2020-07-12 DIAGNOSIS — M545 Chronic low back pain, unspecified back pain laterality, unspecified whether sciatica present: Principal | ICD-10-CM

## 2020-07-12 DIAGNOSIS — R52 Pain, unspecified: Principal | ICD-10-CM

## 2020-07-12 MED ORDER — PREGABALIN 50 MG CAPSULE
ORAL_CAPSULE | 1 refills | 0 days
Start: 2020-07-12 — End: ?

## 2020-07-12 MED ORDER — OXYCODONE-ACETAMINOPHEN 7.5 MG-325 MG TABLET
ORAL_TABLET | 0 refills | 0 days
Start: 2020-07-12 — End: ?

## 2020-07-12 MED ORDER — MORPHINE ER 15 MG TABLET,EXTENDED RELEASE
ORAL_TABLET | 0 refills | 0 days
Start: 2020-07-12 — End: ?

## 2020-07-13 MED ORDER — OXYCODONE-ACETAMINOPHEN 7.5 MG-325 MG TABLET
ORAL_TABLET | 0 refills | 0 days | Status: CP
Start: 2020-07-13 — End: ?

## 2020-07-13 MED ORDER — MORPHINE ER 15 MG TABLET,EXTENDED RELEASE
ORAL_TABLET | 0 refills | 0 days | Status: CP
Start: 2020-07-13 — End: ?

## 2020-07-18 ENCOUNTER — Ambulatory Visit: Admit: 2020-07-18 | Payer: MEDICARE

## 2020-07-29 ENCOUNTER — Encounter: Admit: 2020-07-29 | Discharge: 2020-07-30 | Payer: MEDICARE

## 2020-07-29 DIAGNOSIS — Z5181 Encounter for therapeutic drug level monitoring: Principal | ICD-10-CM

## 2020-07-29 DIAGNOSIS — Z136 Encounter for screening for cardiovascular disorders: Principal | ICD-10-CM

## 2020-07-29 DIAGNOSIS — E1140 Type 2 diabetes mellitus with diabetic neuropathy, unspecified: Principal | ICD-10-CM

## 2020-07-29 DIAGNOSIS — D509 Iron deficiency anemia, unspecified: Principal | ICD-10-CM

## 2020-07-30 ENCOUNTER — Encounter
Admit: 2020-07-30 | Discharge: 2020-07-31 | Disposition: A | Discharge status: Home / Self Care | Payer: MEDICARE | Attending: Emergency Medicine

## 2020-07-31 MED ORDER — POLYETHYLENE GLYCOL 3350 17 GRAM ORAL POWDER PACKET
PACK | Freq: Every day | ORAL | 0 refills | 30 days | Status: CP
Start: 2020-07-31 — End: 2020-08-30

## 2020-07-31 MED ORDER — SUCRALFATE 1 GRAM TABLET
ORAL_TABLET | Freq: Four times a day (QID) | ORAL | 0 refills | 30 days | Status: CP
Start: 2020-07-31 — End: 2020-08-30

## 2020-07-31 MED ORDER — DICYCLOMINE 20 MG TABLET
ORAL_TABLET | Freq: Two times a day (BID) | ORAL | 0 refills | 10 days | Status: CP
Start: 2020-07-31 — End: 2020-08-10

## 2020-08-05 ENCOUNTER — Encounter: Admit: 2020-08-05 | Discharge: 2020-08-06 | Payer: MEDICARE

## 2020-08-05 DIAGNOSIS — D509 Iron deficiency anemia, unspecified: Principal | ICD-10-CM

## 2020-08-05 DIAGNOSIS — M545 Chronic low back pain, unspecified back pain laterality, unspecified whether sciatica present: Principal | ICD-10-CM

## 2020-08-05 DIAGNOSIS — M87051 Idiopathic aseptic necrosis of right femur: Principal | ICD-10-CM

## 2020-08-05 DIAGNOSIS — Z0000 Encounter for general adult medical examination without abnormal findings: Principal | ICD-10-CM

## 2020-08-05 DIAGNOSIS — G8929 Other chronic pain: Principal | ICD-10-CM

## 2020-08-05 DIAGNOSIS — F319 Bipolar disorder, unspecified: Principal | ICD-10-CM

## 2020-08-05 DIAGNOSIS — R590 Localized enlarged lymph nodes: Principal | ICD-10-CM

## 2020-08-05 DIAGNOSIS — M87052 Idiopathic aseptic necrosis of left femur: Principal | ICD-10-CM

## 2020-08-05 DIAGNOSIS — E1140 Type 2 diabetes mellitus with diabetic neuropathy, unspecified: Principal | ICD-10-CM

## 2020-08-05 MED ORDER — METHYLCELLULOSE (WITH SUGAR) ORAL POWDER
11 refills | 0 days | Status: CP
Start: 2020-08-05 — End: ?

## 2020-08-06 MED ORDER — OLOPATADINE 0.1 % EYE DROPS
3 refills | 0 days
Start: 2020-08-06 — End: ?

## 2020-08-08 MED ORDER — OLOPATADINE 0.1 % EYE DROPS
3 refills | 0 days | Status: CP
Start: 2020-08-08 — End: ?

## 2020-08-11 DIAGNOSIS — R52 Pain, unspecified: Principal | ICD-10-CM

## 2020-08-11 DIAGNOSIS — G8929 Other chronic pain: Principal | ICD-10-CM

## 2020-08-11 DIAGNOSIS — M545 Chronic low back pain, unspecified back pain laterality, unspecified whether sciatica present: Principal | ICD-10-CM

## 2020-08-11 MED ORDER — MORPHINE ER 15 MG TABLET,EXTENDED RELEASE
ORAL_TABLET | Freq: Three times a day (TID) | ORAL | 0 refills | 30.00000 days
Start: 2020-08-11 — End: ?

## 2020-08-11 MED ORDER — OXYCODONE-ACETAMINOPHEN 7.5 MG-325 MG TABLET
ORAL_TABLET | 0 refills | 0.00000 days
Start: 2020-08-11 — End: ?

## 2020-09-04 ENCOUNTER — Emergency Department: Payer: 59

## 2020-09-04 DIAGNOSIS — Z91012 Allergy to eggs: Secondary | ICD-10-CM

## 2020-09-04 DIAGNOSIS — M5432 Sciatica, left side: Secondary | ICD-10-CM | POA: Diagnosis present

## 2020-09-04 DIAGNOSIS — M5136 Other intervertebral disc degeneration, lumbar region: Secondary | ICD-10-CM | POA: Diagnosis present

## 2020-09-04 DIAGNOSIS — W19XXXA Unspecified fall, initial encounter: Secondary | ICD-10-CM | POA: Diagnosis present

## 2020-09-04 DIAGNOSIS — F32A Depression, unspecified: Secondary | ICD-10-CM | POA: Diagnosis present

## 2020-09-04 DIAGNOSIS — K589 Irritable bowel syndrome without diarrhea: Secondary | ICD-10-CM | POA: Diagnosis present

## 2020-09-04 DIAGNOSIS — S83282A Other tear of lateral meniscus, current injury, left knee, initial encounter: Secondary | ICD-10-CM | POA: Diagnosis not present

## 2020-09-04 DIAGNOSIS — E1142 Type 2 diabetes mellitus with diabetic polyneuropathy: Secondary | ICD-10-CM | POA: Diagnosis present

## 2020-09-04 DIAGNOSIS — Y92009 Unspecified place in unspecified non-institutional (private) residence as the place of occurrence of the external cause: Secondary | ICD-10-CM

## 2020-09-04 DIAGNOSIS — S86112A Strain of other muscle(s) and tendon(s) of posterior muscle group at lower leg level, left leg, initial encounter: Secondary | ICD-10-CM | POA: Diagnosis present

## 2020-09-04 DIAGNOSIS — Z888 Allergy status to other drugs, medicaments and biological substances status: Secondary | ICD-10-CM

## 2020-09-04 DIAGNOSIS — R296 Repeated falls: Secondary | ICD-10-CM | POA: Diagnosis present

## 2020-09-04 DIAGNOSIS — G9050 Complex regional pain syndrome I, unspecified: Secondary | ICD-10-CM | POA: Diagnosis present

## 2020-09-04 DIAGNOSIS — Z6841 Body Mass Index (BMI) 40.0 and over, adult: Secondary | ICD-10-CM

## 2020-09-04 DIAGNOSIS — Z91041 Radiographic dye allergy status: Secondary | ICD-10-CM

## 2020-09-04 DIAGNOSIS — F419 Anxiety disorder, unspecified: Secondary | ICD-10-CM | POA: Diagnosis present

## 2020-09-04 DIAGNOSIS — E039 Hypothyroidism, unspecified: Secondary | ICD-10-CM | POA: Diagnosis present

## 2020-09-04 DIAGNOSIS — S83512A Sprain of anterior cruciate ligament of left knee, initial encounter: Secondary | ICD-10-CM | POA: Diagnosis present

## 2020-09-04 DIAGNOSIS — E7800 Pure hypercholesterolemia, unspecified: Secondary | ICD-10-CM | POA: Diagnosis present

## 2020-09-04 DIAGNOSIS — M48061 Spinal stenosis, lumbar region without neurogenic claudication: Secondary | ICD-10-CM | POA: Diagnosis present

## 2020-09-04 DIAGNOSIS — S86812A Strain of other muscle(s) and tendon(s) at lower leg level, left leg, initial encounter: Secondary | ICD-10-CM | POA: Diagnosis present

## 2020-09-04 DIAGNOSIS — Z885 Allergy status to narcotic agent status: Secondary | ICD-10-CM

## 2020-09-04 DIAGNOSIS — Z91040 Latex allergy status: Secondary | ICD-10-CM

## 2020-09-04 DIAGNOSIS — Z87891 Personal history of nicotine dependence: Secondary | ICD-10-CM

## 2020-09-04 DIAGNOSIS — Z79891 Long term (current) use of opiate analgesic: Secondary | ICD-10-CM

## 2020-09-04 DIAGNOSIS — Z20822 Contact with and (suspected) exposure to covid-19: Secondary | ICD-10-CM | POA: Diagnosis present

## 2020-09-04 DIAGNOSIS — S83412A Sprain of medial collateral ligament of left knee, initial encounter: Secondary | ICD-10-CM | POA: Diagnosis present

## 2020-09-04 DIAGNOSIS — I10 Essential (primary) hypertension: Secondary | ICD-10-CM | POA: Diagnosis present

## 2020-09-04 DIAGNOSIS — Z21 Asymptomatic human immunodeficiency virus [HIV] infection status: Secondary | ICD-10-CM | POA: Diagnosis present

## 2020-09-04 DIAGNOSIS — K7581 Nonalcoholic steatohepatitis (NASH): Secondary | ICD-10-CM | POA: Diagnosis present

## 2020-09-04 DIAGNOSIS — M25552 Pain in left hip: Secondary | ICD-10-CM | POA: Diagnosis not present

## 2020-09-04 DIAGNOSIS — Z8349 Family history of other endocrine, nutritional and metabolic diseases: Secondary | ICD-10-CM

## 2020-09-04 DIAGNOSIS — Z79899 Other long term (current) drug therapy: Secondary | ICD-10-CM

## 2020-09-04 NOTE — ED Notes (Signed)
Pt placed on bedpan, unable to urinate at this time. Warm blankets provided. Call bell within reach in subwait

## 2020-09-04 NOTE — ED Triage Notes (Signed)
Pt c/o L knee and L hip pain.  Pain score 10/10.  Pt has not taken anything for pain.  Pt reports she felt a pop and what felt like a "mule kick" while standing outside her car earlier.  Sts she had the same thing happen again after trying to walk at her house.  C/o burning pain and spasm/"[place] Horse" in L groin/buttocks.  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  Sts she is supposed to have a hip replacement, if she can lose weight.

## 2020-09-04 NOTE — ED Notes (Signed)
Pt's emergency contact is her mother, [place] [phone].

## 2020-09-04 NOTE — ED Triage Notes (Signed)
Pt in via EMS from a friends [facility]. EMS reports pt was getting out of the car at a friends house and her left knee popped and she fell. Pt also c/o pain and hx of necrosis in her hip. 123/82, HR 112, 99% RA. Pt HIV (+)

## 2020-09-04 NOTE — ED Notes (Signed)
Pt in subwait with call light going off, answered, pt states she has had her call light on for an hour.  Pt asking for the head of the bed to be raised up, which I put up to where she wanted it. Pt states she is in pain and is wanting something for pain, offered her Tylenol, pt stated, "then get me the damn doctor"  Advised pt she is waiting to be seen by the doctor, informed her that there were still 7 people ahead of her. Pt made the comment that she was going to be seen at 2 or 3 am, advised pt I do not know how long it will be until she is seen.  Pt states "I know how emergency departments work" Pt asking about being transferred, advised her we do not transfer patients until they are seen by the EDP and it is necessary to be transferred.  Again I offered the patient Tylenol but declined. Pt made the comment as I was walking away that she was going to complain "about neglect"  Pt was placed on a stretcher when she arrived to hospital for comfort.  Pt had been in the triage area where staff could assist her if needed.  Pt then moved to subwait area after she was assisted to the bedpan where there is a TV and the lights can be dimmed.

## 2020-09-05 ENCOUNTER — Emergency Department: Payer: 59

## 2020-09-05 ENCOUNTER — Encounter: Payer: Self-pay | Admitting: Internal Medicine

## 2020-09-05 ENCOUNTER — Inpatient Hospital Stay
Admission: EM | Admit: 2020-09-05 | Discharge: 2020-09-16 | DRG: 563 | Disposition: A | Discharge status: Skilled Nursing Facility | Payer: 59 | Attending: Internal Medicine | Admitting: Internal Medicine

## 2020-09-05 DIAGNOSIS — G8929 Other chronic pain: Principal | ICD-10-CM

## 2020-09-05 DIAGNOSIS — M5440 Lumbago with sciatica, unspecified side: Secondary | ICD-10-CM

## 2020-09-05 DIAGNOSIS — G9050 Complex regional pain syndrome I, unspecified: Secondary | ICD-10-CM | POA: Diagnosis present

## 2020-09-05 DIAGNOSIS — F418 Other specified anxiety disorders: Secondary | ICD-10-CM | POA: Diagnosis present

## 2020-09-05 DIAGNOSIS — N179 Acute kidney failure, unspecified: Secondary | ICD-10-CM | POA: Diagnosis not present

## 2020-09-05 DIAGNOSIS — M25562 Pain in left knee: Secondary | ICD-10-CM | POA: Diagnosis present

## 2020-09-05 DIAGNOSIS — M25552 Pain in left hip: Secondary | ICD-10-CM | POA: Diagnosis present

## 2020-09-05 DIAGNOSIS — I10 Essential (primary) hypertension: Secondary | ICD-10-CM | POA: Diagnosis present

## 2020-09-05 DIAGNOSIS — W19XXXA Unspecified fall, initial encounter: Secondary | ICD-10-CM

## 2020-09-05 DIAGNOSIS — M549 Dorsalgia, unspecified: Secondary | ICD-10-CM | POA: Diagnosis present

## 2020-09-05 DIAGNOSIS — B20 Human immunodeficiency virus [HIV] disease: Secondary | ICD-10-CM | POA: Diagnosis present

## 2020-09-05 DIAGNOSIS — G9059 Complex regional pain syndrome I of other specified site: Secondary | ICD-10-CM | POA: Diagnosis not present

## 2020-09-05 DIAGNOSIS — Y92009 Unspecified place in unspecified non-institutional (private) residence as the place of occurrence of the external cause: Secondary | ICD-10-CM

## 2020-09-05 DIAGNOSIS — M545 Chronic low back pain, unspecified back pain laterality, unspecified whether sciatica present: Principal | ICD-10-CM

## 2020-09-05 DIAGNOSIS — R52 Pain, unspecified: Principal | ICD-10-CM

## 2020-09-05 HISTORY — DX: Essential (primary) hypertension: I10

## 2020-09-05 LAB — CBC WITH DIFFERENTIAL/PLATELET
Abs Immature Granulocytes: 0.04 10*3/uL (ref 0.00–0.07)
Basophils Absolute: 0.1 10*3/uL (ref 0.0–0.1)
Basophils Relative: 1 %
Eosinophils Absolute: 0.1 10*3/uL (ref 0.0–0.5)
Eosinophils Relative: 1 %
HCT: 35.8 % — ABNORMAL LOW (ref 36.0–46.0)
Hemoglobin: 11.2 g/dL — ABNORMAL LOW (ref 12.0–15.0)
Immature Granulocytes: 0 %
Lymphocytes Relative: 32 %
Lymphs Abs: 2.8 10*3/uL (ref 0.7–4.0)
MCH: 26.8 pg (ref 26.0–34.0)
MCHC: 31.3 g/dL (ref 30.0–36.0)
MCV: 85.6 fL (ref 80.0–100.0)
Monocytes Absolute: 0.7 10*3/uL (ref 0.1–1.0)
Monocytes Relative: 8 %
Neutro Abs: 5.3 10*3/uL (ref 1.7–7.7)
Neutrophils Relative %: 58 %
Platelets: 263 10*3/uL (ref 150–400)
RBC: 4.18 MIL/uL (ref 3.87–5.11)
RDW: 16.6 % — ABNORMAL HIGH (ref 11.5–15.5)
WBC: 9 10*3/uL (ref 4.0–10.5)
nRBC: 0 % (ref 0.0–0.2)

## 2020-09-05 LAB — BASIC METABOLIC PANEL
Anion gap: 7 (ref 5–15)
BUN: 17 mg/dL (ref 6–20)
CO2: 27 mmol/L (ref 22–32)
Calcium: 8.9 mg/dL (ref 8.9–10.3)
Chloride: 105 mmol/L (ref 98–111)
Creatinine, Ser: 1.18 mg/dL — ABNORMAL HIGH (ref 0.44–1.00)
GFR, Estimated: 56 mL/min — ABNORMAL LOW (ref >60)
Glucose, Bld: 146 mg/dL — ABNORMAL HIGH (ref 70–99)
Potassium: 3.8 mmol/L (ref 3.5–5.1)
Sodium: 139 mmol/L (ref 135–145)

## 2020-09-05 LAB — RESP PANEL BY RT-PCR (FLU A&B, COVID) ARPGX2
Influenza A by PCR: NEGATIVE
Influenza B by PCR: NEGATIVE
SARS Coronavirus 2 by RT PCR: NEGATIVE

## 2020-09-05 MED ORDER — MORPHINE ER 15 MG TABLET,EXTENDED RELEASE
ORAL_TABLET | 0 refills | 0 days
Start: 2020-09-05 — End: ?

## 2020-09-05 MED ORDER — OXYCODONE-ACETAMINOPHEN 7.5 MG-325 MG TABLET
ORAL_TABLET | 0 refills | 0 days
Start: 2020-09-05 — End: ?

## 2020-09-05 MED ORDER — CLOTRIMAZOLE-BETAMETHASONE 1-0.05 % EX CREA
1.0000 "application " | TOPICAL_CREAM | Freq: Two times a day (BID) | CUTANEOUS | Status: DC | PRN
  Filled 2020-09-05: qty 15

## 2020-09-05 MED ORDER — PANTOPRAZOLE SODIUM 40 MG PO TBEC
40.0000 mg | DELAYED_RELEASE_TABLET | Freq: Two times a day (BID) | ORAL | Status: DC
  Administered 2020-09-05 – 2020-09-16 (×22): 40 mg via ORAL
  Filled 2020-09-05 (×22): qty 1

## 2020-09-05 MED ORDER — ENOXAPARIN SODIUM 80 MG/0.8ML IJ SOSY
0.5000 mg/kg | PREFILLED_SYRINGE | INTRAMUSCULAR | Status: DC
  Administered 2020-09-05 – 2020-09-15 (×11): 80 mg via SUBCUTANEOUS
  Filled 2020-09-05 (×14): qty 0.8

## 2020-09-05 MED ORDER — MORPHINE SULFATE ER 15 MG PO TBCR
15.0000 mg | EXTENDED_RELEASE_TABLET | Freq: Two times a day (BID) | ORAL | Status: DC
  Administered 2020-09-05 – 2020-09-07 (×4): 15 mg via ORAL
  Filled 2020-09-05 (×4): qty 1

## 2020-09-05 MED ORDER — SENNOSIDES-DOCUSATE SODIUM 8.6-50 MG PO TABS
1.0000 | ORAL_TABLET | Freq: Every evening | ORAL | Status: DC | PRN
  Administered 2020-09-15: 1 via ORAL
  Filled 2020-09-05: qty 1

## 2020-09-05 MED ORDER — FENTANYL CITRATE PF 50 MCG/ML IJ SOSY
25.0000 ug | PREFILLED_SYRINGE | INTRAMUSCULAR | Status: DC | PRN
  Administered 2020-09-05 (×3): 25 ug via INTRAVENOUS
  Filled 2020-09-05 (×3): qty 1

## 2020-09-05 MED ORDER — ALPRAZOLAM 0.5 MG PO TABS
1.0000 mg | ORAL_TABLET | Freq: Three times a day (TID) | ORAL | Status: DC | PRN
  Administered 2020-09-05 – 2020-09-16 (×19): 1 mg via ORAL
  Filled 2020-09-05 (×20): qty 2

## 2020-09-05 MED ORDER — OXYCODONE-ACETAMINOPHEN 5-325 MG PO TABS
1.0000 | ORAL_TABLET | ORAL | Status: DC | PRN
  Administered 2020-09-05 – 2020-09-07 (×6): 1 via ORAL
  Filled 2020-09-05 (×8): qty 1

## 2020-09-05 MED ORDER — METHYLNALTREXONE BROMIDE 150 MG PO TABS: 450.0000 mg | ORAL_TABLET | Freq: Every day | ORAL | Status: DC

## 2020-09-05 MED ORDER — HYDRALAZINE HCL 20 MG/ML IJ SOLN: 5.0000 mg | INTRAMUSCULAR | Status: DC | PRN

## 2020-09-05 MED ORDER — HYDROMORPHONE HCL 1 MG/ML IJ SOLN
1.0000 mg | Freq: Once | INTRAMUSCULAR | Status: AC
Start: 2020-09-05 — End: 2020-09-05
  Administered 2020-09-05: 1 mg via INTRAVENOUS
  Filled 2020-09-05: qty 1

## 2020-09-05 MED ORDER — LURASIDONE HCL 40 MG PO TABS
120.0000 mg | ORAL_TABLET | Freq: Every day | ORAL | Status: DC
  Administered 2020-09-06 – 2020-09-15 (×11): 120 mg via ORAL
  Filled 2020-09-05 (×14): qty 3

## 2020-09-05 MED ORDER — ELETRIPTAN HYDROBROMIDE 20 MG PO TABS
20.0000 mg | ORAL_TABLET | ORAL | Status: DC | PRN
  Filled 2020-09-05: qty 1

## 2020-09-05 MED ORDER — PREGABALIN 50 MG PO CAPS
50.0000 mg | ORAL_CAPSULE | Freq: Two times a day (BID) | ORAL | Status: DC
  Administered 2020-09-05 – 2020-09-07 (×4): 50 mg via ORAL
  Filled 2020-09-05 (×4): qty 1

## 2020-09-05 MED ORDER — AMPHETAMINE-DEXTROAMPHET ER 30 MG PO CP24
30.0000 mg | ORAL_CAPSULE | Freq: Two times a day (BID) | ORAL | Status: DC
  Administered 2020-09-06 – 2020-09-09 (×4): 30 mg via ORAL
  Filled 2020-09-05 (×4): qty 1

## 2020-09-05 MED ORDER — ADULT MULTIVITAMIN W/MINERALS CH
1.0000 | ORAL_TABLET | Freq: Every day | ORAL | Status: DC
  Administered 2020-09-06 – 2020-09-15 (×10): 1 via ORAL
  Filled 2020-09-05 (×10): qty 1

## 2020-09-05 MED ORDER — ACETAMINOPHEN 325 MG PO TABS: 650.0000 mg | ORAL_TABLET | Freq: Four times a day (QID) | ORAL | Status: DC | PRN

## 2020-09-05 MED ORDER — BICTEGRAVIR-EMTRICITAB-TENOFOV 50-200-25 MG PO TABS
1.0000 | ORAL_TABLET | Freq: Every day | ORAL | Status: DC
  Administered 2020-09-05 – 2020-09-08 (×4): 1 via ORAL
  Filled 2020-09-05 (×7): qty 1

## 2020-09-05 MED ORDER — ZIPRASIDONE HCL 40 MG PO CAPS
40.0000 mg | ORAL_CAPSULE | Freq: Two times a day (BID) | ORAL | Status: DC
  Administered 2020-09-06 – 2020-09-16 (×22): 40 mg via ORAL
  Filled 2020-09-05 (×23): qty 1

## 2020-09-05 MED ORDER — CYCLOBENZAPRINE HCL 10 MG PO TABS
5.0000 mg | ORAL_TABLET | Freq: Two times a day (BID) | ORAL | Status: DC
  Administered 2020-09-05 – 2020-09-13 (×17): 5 mg via ORAL
  Filled 2020-09-05 (×17): qty 1

## 2020-09-05 MED ORDER — ONDANSETRON HCL 4 MG/2ML IJ SOLN: 4.0000 mg | Freq: Three times a day (TID) | INTRAMUSCULAR | Status: DC | PRN

## 2020-09-05 MED ORDER — ALLOPURINOL 300 MG PO TABS
300.0000 mg | ORAL_TABLET | Freq: Every day | ORAL | Status: DC
  Administered 2020-09-05 – 2020-09-16 (×12): 300 mg via ORAL
  Filled 2020-09-05 (×13): qty 1

## 2020-09-05 MED ORDER — ONDANSETRON HCL 4 MG/2ML IJ SOLN
4.0000 mg | Freq: Once | INTRAMUSCULAR | Status: AC
  Administered 2020-09-05: 4 mg via INTRAVENOUS
  Filled 2020-09-05: qty 2

## 2020-09-05 NOTE — ED Notes (Signed)
Pt brief changed and cleaned up.

## 2020-09-05 NOTE — ED Notes (Signed)
Removed [place].  Pt states that it was put on early this morning d/t pain meds being given.  O2 sats 95-100 at this time.

## 2020-09-05 NOTE — ED Notes (Signed)
Pt given sandwich tray 

## 2020-09-05 NOTE — ED Notes (Signed)
Pt eating from lunch tray. Visitor in room.

## 2020-09-05 NOTE — H&P (Signed)
History and Physical    Nicole Logan FTD:[id number] DOB: [birth year] DOA: 09/05/2020  Referring MD/NP/PA:   PCP: Cory Roughen, PA-C   Patient coming from:  The patient is coming from home.  At baseline, pt is independent for most of ADL.        Chief Complaint: fall and pain in left hip, left knee, lower back  HPI: Nicole Logan is a 50 y.o. female with medical history significant of morbid obesity hypertension, complex regional pain syndrome type I, HIV infection, hypertension, morbid obesity with BMI 60.08, who presents with fall, pain in left hip, left knee, lower back.  Patient states that she accidentally fell on the driveway when she was out of the car this morning.  No loss of consciousness.  She developed pain in left knee, left hip and lower back.  The pain is constant, severe, sharp.  The back pain is radiating to the posterior left leg with intermittent tingling.  No leg weakness.  The left hip pain is radiating to the left groin and buttock area.  Patient states that because of severe pain, she cannot bear weight, with difficulty walking.  No loss control of bladder or bowel movement. She strongly denies any head or neck injury.  She refused CT of head or neck. Patient has nausea, no vomiting, diarrhea or abdominal pain.  No symptoms of UTI.  Patient does not have chest pain, cough, shortness breath.  Patient refused COVID-19 test, stating that test procedure can cause cancer.  ED Course: pt was found to have WBC 9.0, AKI with creatinine 1.18 and BUN 70 with a GFR 56 (creatinine 1.07 on 07/30/2020).  Temperature normal, blood pressure 126/113, heart rate 110, RR 24, oxygen saturation 93-100% on room air.  CT scan of left hip and lumbar spine are negative for acute bony fracture.  X-ray of left hip and left knee are negative for acute bony fracture.  CT-left hip: Moderate left hip joint degeneration has progressed since 2012. No acute osseous abnormality identified.  CT-L  spin:  1. No acute osseous abnormality in the lumbar spine. 2. Lumbar spine degeneration maximal at L4-L5 and L5-S1 with advanced facet degeneration. Mild spinal stenosis suspected at L4-L5. Up to moderate L5 neural foraminal stenosis. 3. Benign left adrenal adenoma. Punctate left nephrolithiasis. Diverticulosis of the descending and sigmoid colon.   Review of Systems:   General: no fevers, chills, no body weight gain, has fatigue HEENT: no blurry vision, hearing changes or sore throat Respiratory: no dyspnea, coughing, wheezing CV: no chest pain, no palpitations GI: has nausea, no vomiting, abdominal pain, diarrhea, constipation GU: no dysuria, burning on urination, increased urinary frequency, hematuria  Ext: no leg edema Neuro: no unilateral weakness, no vision change or hearing loss Skin: no rash, no skin tear. MSK: has pian in left hip, left knee and lower back Heme: No easy bruising.  Travel history: No recent long distant travel.  Allergy:  Allergies  Allergen Reactions   Latex Hives, Itching, Rash and Swelling   Atenolol Other (See Comments)   Diatrizoate Itching   Lamotrigine Other (See Comments)    Chest discomfort/ anxiety attack Chest discomfort/ anxiety attack    Lisinopril Other (See Comments)    Headaches, sick on stomach   Morphine And Related Other (See Comments)    "severe migraine"   Tramadol Other (See Comments)    "severe migraine"   Eggs Or Egg-Derived Products Diarrhea and Rash   Iodinated Diagnostic Agents Other (See  Comments) and Rash    HIves, "I feel like I'm on fire"    Past Medical History:  Diagnosis Date   CRPS (complex regional pain syndrome type I)    Hypertension     Past Surgical History:  Procedure Laterality Date   LITHOTRIPSY     TONSILLECTOMY      Social History:  reports that she has quit smoking. Her smoking use included cigarettes. She smoked an average of 1 pack per day. She has never used smokeless tobacco. She  reports that she does not currently use alcohol. She reports that she does not use drugs.  Family History:  Family History  Problem Relation Age of Onset   Thyroid disease Mother    Kidney disease Mother    Thyroid disease Sister      Prior to Admission medications   Medication Sig Start Date End Date Taking? Authorizing Provider  morphine (MS CONTIN) 15 MG 12 hr tablet Take 1 tablet by mouth 3 (three) times daily. 08/12/20  Yes [provider]  oxyCODONE-acetaminophen (PERCOCET) 7.5-325 MG tablet Take 1 tablet by mouth every 6 (six) hours as needed. 08/12/20  Yes [provider]    Physical Exam: Vitals:   09/05/20 1312 09/05/20 1416 09/05/20 1657 09/05/20 1658  BP:  110/75  119/64  Pulse:  98 95 96  Resp:  18  18  Temp:      TempSrc:      SpO2: 95% 94% 92% 97%  Weight:      Height:       General: Not in acute distress HEENT:       Eyes: PERRL, EOMI, no scleral icterus.       ENT: No discharge from the ears and nose, no pharynx injection, no tonsillar enlargement.        Neck: No JVD, no bruit, no mass felt. Heme: No neck lymph node enlargement. Cardiac: S1/S2, RRR, No murmurs, No gallops or rubs. Respiratory: No rales, wheezing, rhonchi or rubs. GI: Soft, nondistended, nontender, no rebound pain, no organomegaly, BS present. GU: No hematuria Ext: No pitting leg edema bilaterally. 1+DP/PT pulse bilaterally. Musculoskeletal: has tenderness in left hip, left knee and lower back Skin: No rashes.  Neuro: Alert, oriented X3, cranial nerves II-XII grossly intact, moves all extremities normally.  Psych: Patient is not psychotic, no suicidal or hemocidal ideation.  Labs on Admission: I have personally reviewed following labs and imaging studies  CBC: Recent Labs  Lab 09/05/20 0248  WBC 9.0  NEUTROABS 5.3  HGB 11.2*  HCT 35.8*  MCV 85.6  PLT 263   Basic Metabolic Panel: Recent Labs  Lab 09/05/20 0248  NA 139  K 3.8  CL 105  CO2 27  GLUCOSE 146*   BUN 17  CREATININE 1.18*  CALCIUM 8.9   GFR: Estimated Creatinine Clearance: 85.7 mL/min (A) (by C-G formula based on SCr of 1.18 mg/dL (H)). Liver Function Tests: No results for input(s): AST, ALT, ALKPHOS, BILITOT, PROT, ALBUMIN in the last 168 hours. No results for input(s): LIPASE, AMYLASE in the last 168 hours. No results for input(s): AMMONIA in the last 168 hours. Coagulation Profile: No results for input(s): INR, PROTIME in the last 168 hours. Cardiac Enzymes: No results for input(s): CKTOTAL, CKMB, CKMBINDEX, TROPONINI in the last 168 hours. BNP (last 3 results) No results for input(s): PROBNP in the last 8760 hours. HbA1C: No results for input(s): HGBA1C in the last 72 hours. CBG: No results for input(s): GLUCAP in the last  168 hours. Lipid Profile: No results for input(s): CHOL, HDL, LDLCALC, TRIG, CHOLHDL, LDLDIRECT in the last 72 hours. Thyroid Function Tests: No results for input(s): TSH, T4TOTAL, FREET4, T3FREE, THYROIDAB in the last 72 hours. Anemia Panel: No results for input(s): VITAMINB12, FOLATE, FERRITIN, TIBC, IRON, RETICCTPCT in the last 72 hours. Urine analysis: No results found for: COLORURINE, APPEARANCEUR, LABSPEC, PHURINE, GLUCOSEU, HGBUR, BILIRUBINUR, KETONESUR, PROTEINUR, UROBILINOGEN, NITRITE, LEUKOCYTESUR Sepsis Labs: @LABRCNTIP (procalcitonin:4,lacticidven:4) )No results found for this or any previous visit (from the past 240 hour(s)).   Radiological Exams on Admission: CT Lumbar Spine Wo Contrast  Result Date: 09/05/2020 CLINICAL DATA:  50 year old female with acute onset low back pain, left hip and knee pain. "Felt a pop" . EXAM: CT LUMBAR SPINE WITHOUT CONTRAST TECHNIQUE: Multidetector CT imaging of the lumbar spine was performed without intravenous contrast administration. Multiplanar CT image reconstructions were also generated. COMPARISON:  CT Abdomen and Pelvis 10/10/2010. FINDINGS: Segmentation: Normal. Alignment: Mild straightening of  lumbar lordosis. Subtle retrolisthesis of L5 on S1. Vertebrae: No acute osseous abnormality identified. Bone mineralization is within normal limits. Mild lower thoracic endplate spurring. Mild chronic appearing irregularity of the superior L3 and L4 endplates, likely Schmorl's nodes. Intact visible sacrum and SI joints. Paraspinal and other soft tissues: Chronic low-density benign left adrenal adenoma(s) have not significantly changed since 2012 on series 5, image 3. Negative visible right adrenal gland, spleen, pancreas, stomach, small bowel, bladder and uterus. Possible hepatic steatosis. Punctate left nephrolithiasis. No hydronephrosis. Diverticulosis in the descending and sigmoid colon. Normal caliber abdominal aorta. No lymphadenopathy. Lumbar paraspinal soft tissues are within normal limits. Disc levels: T11-T12: Mild vacuum disc. Anterior endplate spurring. Otherwise negative. T12-L1:  Anterior endplate spurring, otherwise negative. L1-L2:  Negative. L2-L3: Mild circumferential disc bulge. Mild facet hypertrophy. No stenosis. L3-L4: Mild circumferential disc bulge. Moderate facet hypertrophy. Trace vacuum facet on the right. But no convincing stenosis. L4-L5: Circumferential disc bulge. Moderate to severe facet hypertrophy with vacuum facet greater on the right. Mild spinal and bilateral L4 neural foraminal stenosis. L5-S1: Subtle retrolisthesis with mild circumferential disc bulge. Moderate facet hypertrophy. No convincing spinal stenosis. Mild to moderate bilateral L5 foraminal stenosis greater on the left. IMPRESSION: 1. No acute osseous abnormality in the lumbar spine. 2. Lumbar spine degeneration maximal at L4-L5 and L5-S1 with advanced facet degeneration. Mild spinal stenosis suspected at L4-L5. Up to moderate L5 neural foraminal stenosis. 3. Benign left adrenal adenoma. Punctate left nephrolithiasis. Diverticulosis of the descending and sigmoid colon. Electronically Signed   By: 2013 M.D.   On:  09/05/2020 05:03   CT Hip Left Wo Contrast  Result Date: 09/05/2020 CLINICAL DATA:  50 year old female with acute onset low back pain, left hip and knee pain. "Felt a pop" . EXAM: CT OF THE LEFT HIP WITHOUT CONTRAST TECHNIQUE: Multidetector CT imaging of the left hip was performed according to the standard protocol. Multiplanar CT image reconstructions were also generated. COMPARISON:  Lumbar spine CT today reported separately. CT Abdomen and Pelvis 10/10/2010. FINDINGS: Diverticulosis of the sigmoid colon. Partially visible retained stool in the rectum and mildly distended urinary bladder. Incidental pelvic phleboliths. No left inguinal or pelvic sidewall lymphadenopathy. Left femoral head is normally located. Visible left hemipelvis and proximal left femur are intact. There is left hip joint space loss with femoral head osteophytosis, and anterior lip acetabular subchondral sclerosis and mild fragmentation. These changes are new or progressed since 2012. No regional soft tissue inflammation is evident. No acute osseous abnormality identified. IMPRESSION: Moderate left hip  joint degeneration has progressed since 2012. No acute osseous abnormality identified. Electronically Signed   By: Odessa FlemingH  Hall M.D.   On: 09/05/2020 05:06   DG Knee Complete 4 Views Left  Result Date: 09/04/2020 CLINICAL DATA:  Pain after injury. EXAM: LEFT KNEE - COMPLETE 4+ VIEW COMPARISON:  None. FINDINGS: No evidence of fracture, dislocation, or joint effusion. No evidence of arthropathy or other focal bone abnormality. Soft tissues are unremarkable. IMPRESSION: Negative. Electronically Signed   By: Gerome Samavid  Williams III M.D.   On: 09/04/2020 19:59   DG Hip Unilat With Pelvis 2-3 Views Left  Result Date: 09/04/2020 CLINICAL DATA:  Pain after injury. EXAM: DG HIP (WITH OR WITHOUT PELVIS) 2-3V LEFT COMPARISON:  None. FINDINGS: Mild degenerative changes in the hip with mild loss of superior joint space. No fracture or dislocation. No other  acute abnormalities. IMPRESSION: Mild degenerative changes in the left hip with mild loss of superior joint space. No other acute abnormalities. Electronically Signed   By: Gerome Samavid  Williams III M.D.   On: 09/04/2020 20:01     EKG: Not done in ED, will get one.   Assessment/Plan Principal Problem:   Left hip pain Active Problems:   Back pain   Fall at home, initial encounter   Left knee pain   Hypertension   CRPS (complex regional pain syndrome type I)   AKI (acute kidney injury) (HCC)   HIV (human immunodeficiency virus infection) (HCC)   Depression with anxiety   Left hip pain, back pain and left knee pain: All images negative for acute injury or bony fracture.  CT scan showed moderate left hip joint degeneration which has progressed since 2012. She also has lumbar spinal stenosis and degenerative disc disease.  Patient may have sciatica.  Patient has severe pain, with difficulty walking.  -Placed on MedSurg bed for position -Pain control: As needed fentanyl, Percocet, Flexeril,Lyrica and Tylenol -Patient is on chronic MS Contin -PT/OT  Fall at home, initial encounter -PT/OT  Hypertension: Blood pressure 126/113 -IV hydralazine as needed -Hold Lasix and Benicar due to AKI  CRPS (complex regional pain syndrome type I) -Continue home MS Contin  AKI (acute kidney injury) [facility](HCC): Patient has a mild AKI.  Creatinine 1.18, BUN 17, GFR 56 -Hold Benicar and Lasix  HIV (human immunodeficiency virus infection) (HCC): Viral load <20 on 03/26/2016 -Continue home medications -Check CD4 and viral load  Depression with anxiety -Continue home medications        DVT ppx: SQ Lovenox Code Status: Full code Family Communication: not done, no family member is at bed side.     Disposition Plan:  Anticipate discharge back to previous environment Consults called:  none Admission status and Level of care: Med-Surg:     for obs    Status is: Observation  The patient remains OBS  appropriate and will d/c before 2 midnights.  Dispo: The patient is from: Home              Anticipated d/c is to: Home              Patient currently is not medically stable to d/c.   Difficult to place patient No          Date of Service 09/05/2020    Lorretta HarpXilin Lamira Borin [facility]   If 7PM-7AM, please contact night-coverage [email or web address] 09/05/2020, 6:48 PM

## 2020-09-05 NOTE — ED Notes (Signed)
Pt given dinner tray at this time.  

## 2020-09-05 NOTE — Progress Notes (Signed)
PHARMACIST - PHYSICIAN COMMUNICATION  CONCERNING:  Enoxaparin (Lovenox) for DVT Prophylaxis    RECOMMENDATION: Patient was prescribed enoxaprin 40mg  q24 hours for VTE prophylaxis.   Filed Weights   09/04/20 1850  Weight: (!) 158.8 kg (350 lb)    Body mass index is 60.08 kg/m.  Estimated Creatinine Clearance: 85.7 mL/min (A) (by C-G formula based on SCr of 1.18 mg/dL (H)).   Based on [facility] policy patient is candidate for enoxaparin 0.5mg /kg TBW SQ every 24 hours based on BMI being >30.   DESCRIPTION: Pharmacy has adjusted enoxaparin dose per [facility] policy.  Patient is now receiving enoxaparin 80 mg every 24 hours    [facility], PharmD Clinical Pharmacist  09/05/2020 8:04 PM

## 2020-09-05 NOTE — ED Provider Notes (Signed)
[facility] Emergency Department Provider Note ____________________________________________   Event Date/Time   First MD Initiated Contact with Patient 09/05/20 0117     (approximate)  I have reviewed the triage vital signs and the nursing notes.   HISTORY  Chief Complaint Hip Pain (Knee pain/)    HPI Nicole Logan is a 50 y.o. female with PMH as noted below as well as HIV, hypertension, type 2 diabetes, NASH, peripheral neuropathy, IBS, migraines, hypothyroidism, hypercholesterolemia, hepatitis, and ADHD who presents with left hip and leg pain, cute onset when the patient was standing and felt and heard a pop in her left hip.  She states it felt like she got kicked by a [place].  She sat backwards into the seat of her car.  Subsequently she tried to take 3 or 4 steps but could not bear weight and collapsed to the ground.  She has been unable to bear weight on the left leg since then.  She denies any other trauma or injuries.  She reports numbness and tingling going down the left leg.  She has had chronic problems with her back and has had sciatica in the past.  She also reports a history of necrosis in her left hip but states that her doctors do not want to do a hip replacement until she loses weight.   Past Medical History:  Diagnosis Date   CRPS (complex regional pain syndrome type I)    Hypertension     Patient Active Problem List   Diagnosis Date Noted   Left hip pain 09/05/2020    History reviewed. No pertinent surgical history.  Prior to Admission medications   Medication Sig Start Date End Date Taking? Authorizing Provider  morphine (MS CONTIN) 15 MG 12 hr tablet Take 1 tablet by mouth 3 (three) times daily. 08/12/20  Yes [provider]  oxyCODONE-acetaminophen (PERCOCET) 7.5-325 MG tablet Take 1 tablet by mouth every 6 (six) hours as needed. 08/12/20  Yes [provider]    Allergies Latex, Atenolol, Diatrizoate, Lamotrigine,  Lisinopril, Morphine and related, Tramadol, Eggs or egg-derived products, and Iodinated diagnostic agents  No family history on file.  Social History Social History   Tobacco Use   Smoking status: Former    Packs/day: 1.00    Types: Cigarettes   Smokeless tobacco: Never  Vaping Use   Vaping Use: Never used  Substance Use Topics   Alcohol use: Not Currently    Review of Systems  Constitutional: No fever. Eyes: No redness ENT: No neck pain. Cardiovascular: Denies chest pain. Respiratory: Denies shortness of breath. Gastrointestinal: No vomiting. Genitourinary: Negative for flank pain. Musculoskeletal: Negative for back, left hip, and left leg pain. Skin: Negative for rash. Neurological: Negative for headache.  Positive for left leg tingling and numbness.   ____________________________________________   PHYSICAL EXAM:  VITAL SIGNS: ED Triage Vitals  Enc Vitals Group     BP 09/04/20 1849 122/69     Pulse Rate 09/04/20 1849 (!) 104     Resp 09/04/20 1849 (!) 24     Temp 09/04/20 1849 97.9 F (36.6 C)     Temp Source 09/04/20 1849 Oral     SpO2 09/04/20 1849 100 %     Weight 09/04/20 1850 (!) 350 lb (158.8 kg)     Height 09/04/20 1850 5\' 4"  (1.626 m)     Head Circumference --      Peak Flow --      Pain Score 09/04/20 1850 10  Pain Loc --      Pain Edu? --      Excl. in GC? --     Constitutional: Alert and oriented.  Uncomfortable appearing but in no acute distress. Eyes: Conjunctivae are normal.  Head: Atraumatic. Nose: No congestion/rhinnorhea. Mouth/Throat: Mucous membranes are moist.   Neck: Normal range of motion.  Cardiovascular: Normal rate, regular rhythm. Good peripheral circulation. Respiratory: Normal respiratory effort.  No retractions.  Gastrointestinal: No distention.  Musculoskeletal: Extremities warm and well perfused.  Pain on even minimal range of motion of the left hip.  Tenderness to the lateral aspect of the left hip and to the  anterior left knee.  No deformity.  2+ DP pulse. Neurologic:  Normal speech and language.  Subjective decreased sensation to anterior aspect of the left lower leg.  Intact motor strength to the distal lower leg, exam limited due to pain. Skin:  Skin is warm and dry. No rash noted. Psychiatric: Intermittently agitated and labile, verbally redirectable.  ____________________________________________   LABS (all labs ordered are listed, but only abnormal results are displayed)  Labs Reviewed  BASIC METABOLIC PANEL - Abnormal; Notable for the following components:      Result Value   Glucose, Bld 146 (*)    Creatinine, Ser 1.18 (*)    GFR, Estimated 56 (*)    All other components within normal limits  CBC WITH DIFFERENTIAL/PLATELET - Abnormal; Notable for the following components:   Hemoglobin 11.2 (*)    HCT 35.8 (*)    RDW 16.6 (*)    All other components within normal limits  RESP PANEL BY RT-PCR (FLU A&B, COVID) [id number]   ____________________________________________  EKG   ____________________________________________  RADIOLOGY  XR L hip: Degenerative disease with no acute fracture XR L knee: No acute fracture  CT L hip: Degenerative disease with no acute traumatic findings CT lumbar spine: No acute fracture  ____________________________________________   PROCEDURES  Procedure(s) performed: No  Procedures  Critical Care performed: No ____________________________________________   INITIAL IMPRESSION / ASSESSMENT AND PLAN / ED COURSE  Pertinent labs & imaging results that were available during my care of the patient were reviewed by me and considered in my medical decision making (see chart for details).   50 year old female with PMH as noted above including HIV, hypertension, type 2 diabetes, NASH, peripheral neuropathy, IBS, migraines, hypothyroidism, hypercholesterolemia, hepatitis, and ADHD presents with left hip and knee pain after she felt a pop in the leg  gave out on her twice today.  The patient has been unable to bear weight since then.  On my evaluation of the patient, she appeared very upset about the long wait and about issues such as her name being incorrect in the computer.  She was behaving in a manner I perceived to be confrontational.  I apologized for the wait but she continued to berate me and the staff saying inappropriate things including talking about how I would feel if my genitalia were being squeezed and I had to wait in the waiting room.  I told the patient that I would not tolerate further inappropriate comments, however I assured the patient that I was here to help her and was able to eventually verbally redirect her and get her cooperation with history and exam.  I reviewed the past medical records in Care Everywhere.  The patient normally follows at [facility].  She was last seen there with abdominal pain on 7/23 with a negative work-up.  She has a history of  chronic low back pain and is on MS-Contin and oxycodone.  On exam the patient is uncomfortable appearing and has pain on even minimal movement of the left hip or knee, with some tenderness to palpation but no deformity.  Pulses are intact.  The patient is able to plantar dorsiflex the foot; the rest of the motor exam is limited due to pain and cooperation.  She reports some decreased sensation and paresthesias along the front of the lower leg.  X-rays obtained from triage are negative.  I have a low suspicion for acute fracture given the lack of specific trauma.  I suspect either sciatica versus ligament or tendon injury.  Although the exam is somewhat limited due to pain and the patient's cooperation, there is no evidence of cauda equina.  I initially planned to obtain MRIs of the lumbar spine and left hip, however I was informed by the MRI tech that the patient is too large to fit in the scanner.  We will therefore obtain CTs of these areas to evaluate for occult fracture.   Patient will likely need admission for pain control and therapy.  ----------------------------------------- 6:00 AM on 09/05/2020 -----------------------------------------  CT of the lumbar spine showed possible L5 foraminal stenosis and degenerative changes.  This is consistent with sciatica along an L5 distribution.  There are no acute traumatic findings to the left hip.  Given that she cannot ambulate and will need further pain control, the patient will require admission.  I consulted Dr. Arville Care from the hospitalist service.  ____________________________________________   FINAL CLINICAL IMPRESSION(S) / ED DIAGNOSES  Final diagnoses:  Left hip pain      NEW MEDICATIONS STARTED DURING THIS VISIT:  New Prescriptions   No medications on file     Note:  This document was prepared using Dragon voice recognition software and may include unintentional dictation errors.    Dionne Bucy, MD 09/05/20 [phone]

## 2020-09-05 NOTE — ED Notes (Signed)
Pt given cup of ice and warm blanket at this time per pt request.

## 2020-09-05 NOTE — ED Notes (Signed)
Pt removed cardiac leads, O2, BP monitoring, states "they were making me itch."

## 2020-09-05 NOTE — ED Notes (Signed)
Pt refused Covid Test, stated "It is ridden with cancer."  Also stated that 'the swab has cancer giving properties." Pt request blood test for covid and states that she has had it before.

## 2020-09-05 NOTE — ED Notes (Signed)
Lab called at this time to request they draw due blood tests. They stated they would send someone shortly.

## 2020-09-06 ENCOUNTER — Other Ambulatory Visit: Payer: Self-pay

## 2020-09-06 ENCOUNTER — Encounter: Payer: Self-pay | Admitting: Internal Medicine

## 2020-09-06 ENCOUNTER — Observation Stay: Payer: 59

## 2020-09-06 DIAGNOSIS — M25552 Pain in left hip: Secondary | ICD-10-CM | POA: Diagnosis not present

## 2020-09-06 LAB — BASIC METABOLIC PANEL
Anion gap: 9 (ref 5–15)
BUN: 18 mg/dL (ref 6–20)
CO2: 25 mmol/L (ref 22–32)
Calcium: 9.1 mg/dL (ref 8.9–10.3)
Chloride: 104 mmol/L (ref 98–111)
Creatinine, Ser: 0.85 mg/dL (ref 0.44–1.00)
GFR, Estimated: >60 mL/min (ref >60)
Glucose, Bld: 131 mg/dL — ABNORMAL HIGH (ref 70–99)
Potassium: 3.8 mmol/L (ref 3.5–5.1)
Sodium: 138 mmol/L (ref 135–145)

## 2020-09-06 LAB — HIV ANTIBODY (ROUTINE TESTING W REFLEX): HIV Screen 4th Generation wRfx: REACTIVE — AB

## 2020-09-06 NOTE — Plan of Care (Signed)

## 2020-09-06 NOTE — Evaluation (Signed)
Occupational Therapy Evaluation Patient Details Name: Nicole Logan MRN: [id number] DOB: 1970-04-25 Today's Date: 09/06/2020    History of Present Illness 50 y.o. female with PMH as noted below as well as HIV, hypertension, type 2 diabetes, NASH, peripheral neuropathy, IBS, migraines, hypothyroidism, hypercholesterolemia, hepatitis, and ADHD who presents with left hip and leg pain, acute onset when the patient was standing and felt/heard a pop in her left knee.She could not bear weight and collapsed to the ground. 09/04/20 L knee imaging: No evidence of fracture, dislocation, or joint effusion.   Clinical Impression   Nicole Logan was seen for OT evaluation this date. Prior to hospital admission, pt was MOD I for limited mobility/ADLs and community excursions ~1-2x/month. Pt lives alone in 2nd story apartment with limited assistance from friends. Pt presents to acute OT demonstrating impaired ADL performance and functional mobility 2/2 decreased activity tolerance, functional strength/ROM/balance deficits, pain, and poor insight.   Pt currently requires MIN A sup<>sit - assist for LLE mgmt, no assist for bed level scooting up in bed with rails use. SETUP + SUPERVISION toothbrushing seated EOB. SUPERVISION don R sock seated EOB, MAX A don L sock 2/2 pain. MIN A + RW sit<>stand - unable to take meaningful steps and pt self-initiates return to sitting following ~1 min. 10/10 pain in standing (pt premedicated). MOD A lateral scooting along EOB. Pt would benefit from skilled OT to address noted impairments and functional limitations (see below for any additional details) in order to maximize safety and independence while minimizing falls risk and caregiver burden. Upon hospital discharge, recommend STR to maximize pt safety and return to PLOF.     Follow Up Recommendations  SNF    Equipment Recommendations  3 in 1 bedside commode;Wheelchair (measurements OT)    Recommendations for Other Services        Precautions / Restrictions Precautions Precautions: Fall Restrictions Weight Bearing Restrictions: No Other Position/Activity Restrictions: Pt with essentially self imposed LLE NWBing 2/2 fear/pain      Mobility Bed Mobility Overal bed mobility: Needs Assistance Bed Mobility: Supine to Sit;Sit to Supine     Supine to sit: Min assist Sit to supine: Min assist   General bed mobility comments: assist for LLE mgmt    Transfers Overall transfer level: Needs assistance Equipment used: Rolling walker (2 wheeled) Transfers: Sit to/from Stand;Lateral/Scoot Transfers Sit to Stand: Min assist;+2 safety/equipment;From elevated surface        Lateral/Scoot Transfers: Mod assist General transfer comment: rises easily with MIN A, unable to tolerate weight shift on L side or complete hopping    Balance Overall balance assessment: Needs assistance;History of Falls Sitting-balance support: No upper extremity supported;Feet supported Sitting balance-Leahy Scale: Good     Standing balance support: Bilateral upper extremity supported Standing balance-Leahy Scale: Poor                             ADL either performed or assessed with clinical judgement   ADL Overall ADL's : Needs assistance/impaired                                       General ADL Comments: SETUP + SUPERVISION toothbrushing seated EOB. SUPERVISION don R sock seated EOB, MAX A don L sock 2/2 pain. MIN A + RW for ADL t/f - unable to take meaningful steps decreasing to MOD A for  scooting along EOB      Pertinent Vitals/Pain Pain Assessment: 0-10 Pain Score: 10-Worst pain ever Pain Location: states improved pain at rest (~4/10), increases to 10/10 with standing Pain Descriptors / Indicators: Grimacing;Moaning;Shooting;Sharp Pain Intervention(s): Premedicated before session;Repositioned;Limited activity within patient's tolerance;Monitored during session     Hand Dominance Right    Extremity/Trunk Assessment Upper Extremity Assessment Upper Extremity Assessment: Generalized weakness   Lower Extremity Assessment Lower Extremity Assessment: LLE deficits/detail LLE Deficits / Details: limited by pain, toelrates sitting EOB c LLE minimally flexed LLE: Unable to fully assess due to pain       Communication Communication Communication: No difficulties   Cognition Arousal/Alertness: Awake/alert Behavior During Therapy: Anxious Overall Cognitive Status: Within Functional Limits for tasks assessed                                     General Comments  SpO2 high 90s on RA    Exercises Exercises: Other exercises Other Exercises Other Exercises: Pt educated re: OT role, DME recs, d/c recs, falls prevention, ECS, pain mgmt, importance of mobility for functional strengthening, HEP Other Exercises: LBD, grooming, sup<>sit, sit<>stand, lateral scoot, sitting/standing balace/tolerance   Shoulder Instructions      Home Living Family/patient expects to be discharged to:: Private residence Living Arrangements: Alone Available Help at Discharge:  (pt reports limited family/friends) Type of Home: Apartment Home Access: Stairs to enter [employer] of Steps: flight   Home Layout: One level     Bathroom Shower/Tub: Engineer, production Accessibility: Yes How Accessible: Accessible via wheelchair Home Equipment: Walker - 4 wheels;Cane - single point   Additional Comments: 2nd story apartment      Prior Functioning/Environment Level of Independence: Needs assistance  Gait / Transfers Assistance Needed: Apparently pt has recently been struggling to even stand a few minutes in the home, out of home 1-2x/month with increasing difficulty managing steps and having days where she can not even get up... ADL's / Homemaking Assistance Needed: Pt reports she has struggled recently with upkeep of home and herself.            OT Problem  List: Decreased strength;Decreased range of motion;Decreased activity tolerance;Impaired balance (sitting and/or standing);Decreased safety awareness;Decreased knowledge of use of DME or AE      OT Treatment/Interventions: Self-care/ADL training;Therapeutic exercise;Energy conservation;DME and/or AE instruction;Therapeutic activities;Patient/family education;Balance training    OT Goals(Current goals can be found in the care plan section) Acute Rehab OT Goals Patient Stated Goal: control L knee and hip pain OT Goal Formulation: With patient Time For Goal Achievement: 09/20/20 Potential to Achieve Goals: Good ADL Goals Pt Will Perform Grooming: with modified independence;sitting Pt Will Perform Lower Body Dressing: with modified independence;sitting/lateral leans Pt Will Transfer to Toilet: with modified independence;stand pivot transfer;bedside commode (c LRAD PRN)  OT Frequency: Min 2X/week   Barriers to D/C: Inaccessible home environment;Decreased caregiver support          Co-evaluation              AM-PAC OT "6 Clicks" Daily Activity     Outcome Measure Help from another person eating meals?: None Help from another person taking care of personal grooming?: A Little Help from another person toileting, which includes using toliet, bedpan, or urinal?: A Lot Help from another person bathing (including washing, rinsing, drying)?: A Lot Help from another person to put on and taking off  regular upper body clothing?: A Little Help from another person to put on and taking off regular lower body clothing?: A Lot 6 Click Score: 16   End of Session Equipment Utilized During Treatment: Rolling walker;Gait belt  Activity Tolerance: Patient limited by pain Patient left: in bed;with call bell/phone within reach;with bed alarm set;with family/visitor present  OT Visit Diagnosis: Other abnormalities of gait and mobility (R26.89);Muscle weakness (generalized) (M62.81)                 Time: 8127-5170 OT Time Calculation (min): 25 min Charges:  OT General Charges $OT Visit: 1 Visit OT Evaluation $OT Eval Moderate Complexity: 1 Mod OT Treatments $Self Care/Home Management : 8-22 mins  Kathie Dike, M.S. OTR/L  09/06/20, 1:22 PM  ascom [phone]

## 2020-09-06 NOTE — Progress Notes (Signed)
OT Cancellation Note  Patient Details Name: ADLER ALTON MRN: [id number] DOB: [birth year]   Cancelled Treatment:    Reason Eval/Treat Not Completed: Pain limiting ability to participate. Order received and chart reviewed. Per conversation with PT, pt significantly limited by pain and unable to participate in meaningful mobility/ADLs this AM. Will hold and initiate services as able following pain medication.   Kathie Dike, M.S. OTR/L  09/06/20, 9:36 AM  ascom [phone]

## 2020-09-06 NOTE — Plan of Care (Signed)
Pt admitted to unit this shift. Pain controlled this shift with scheduled ms contin. Pt oriented to room and visitation hours. Problem: Nutrition: Goal: Adequate nutrition will be maintained Outcome: Progressing   Problem: Coping: Goal: Level of anxiety will decrease Outcome: Progressing   Problem: Pain Managment: Goal: General experience of comfort will improve Outcome: Progressing

## 2020-09-06 NOTE — Discharge Summary (Signed)
Physician Discharge Summary  [name]:[id number] DOB: August 20, 1970 DOA: 09/05/2020  PCP: Cory Roughen, PA-C  Admit date: 09/05/2020 Discharge date: 09/06/2020  Admitted From: Home Disposition: Home  Recommendations for Outpatient Follow-up:  Follow up with PCP in 1-2 weeks Please obtain BMP/CBC in one week Please follow up on the following pending results: None  Home Health: Yes Equipment/Devices: Rolling walker Discharge Condition: Stable CODE STATUS: Full Diet recommendation: Heart Healthy / Carb Modified   Brief/Interim Summary: Nicole Logan is a 50 y.o. female with medical history significant of morbid obesity hypertension, complex regional pain syndrome type I, HIV infection, hypertension, morbid obesity with BMI 60.08, who presents with fall, pain in left hip, left knee, lower back.   Patient states that she accidentally fell on the driveway when she was out of the car this morning.  No loss of consciousness.  Patient also initially refused COVID testing stating that test procedure can cause cancer.  Looks like a lot of underlying psych issues.  Imaging which include CT of lumbar spine and left hip was without any acute abnormality, did show worsening of degenerative disease.  Patient seems very comfortable, chatting with a friend and eating dinner when seen this morning.  Complaining of left hip pain stating that she cannot climb up to her second floor apartment.  Apparently she was trying to move her apartment to ground floor for the past 1 year.  She was asking if we can expedite that process which was not appropriate.  I told the patient to discuss with primary care provider.  She can continue her current regimen of pain medications and follow-up with her PCP.  Later patient unable to work with PT stating that she is having a lot of pain.  Different stories about hip pain versus knee pain.  She was complaining about hip pain with me.  She was threatening the  staff about discharge.  Orthopedic was also consulted at PT request discussed with and decided to do an MRI of the knee which is still pending.  Patient is medically stable for discharge-can go either home or rehab depending on social worker and patient agreement.  She can continue her home medications and does not need any more pain medications on discharge.  Discharge Diagnoses:  Principal Problem:   Left hip pain Active Problems:   Back pain   Fall at home, initial encounter   Left knee pain   Hypertension   CRPS (complex regional pain syndrome type I)   AKI (acute kidney injury) (HCC)   HIV (human immunodeficiency virus infection) (HCC)   Depression with anxiety   Discharge Instructions  Discharge Instructions     Diet - low sodium heart healthy   Complete by: As directed    Increase activity slowly   Complete by: As directed       Allergies as of 09/06/2020       Reactions   Latex Hives, Itching, Rash, Swelling   Atenolol Other (See Comments)   Diatrizoate Itching   Lamotrigine Other (See Comments)   Chest discomfort/ anxiety attack Chest discomfort/ anxiety attack   Lisinopril Other (See Comments)   Headaches, sick on stomach   Morphine And Related Other (See Comments)   "severe migraine"   Tramadol Other (See Comments)   "severe migraine"   Eggs Or Egg-derived Products Diarrhea, Rash   Iodinated Diagnostic Agents Other (See Comments), Rash   HIves, "I feel like I'm on fire"  Medication List     TAKE these medications    albuterol 108 (90 Base) MCG/ACT inhaler Commonly known as: VENTOLIN HFA Inhale 2 puffs into the lungs every 6 (six) hours as needed for wheezing or shortness of breath.   allopurinol 300 MG tablet Commonly known as: ZYLOPRIM Take 300 mg by mouth daily.   ALPRAZolam 1 MG tablet Commonly known as: XANAX Take 1 mg by mouth 3 (three) times daily as needed for anxiety.   amphetamine-dextroamphetamine 30 MG 24 hr  capsule Commonly known as: ADDERALL XR Take 30 mg by mouth 2 (two) times daily.   Biktarvy 50-200-25 MG Tabs tablet Generic drug: bictegravir-emtricitabine-tenofovir AF Take 1 tablet by mouth at bedtime.   Centrum Silver 50+Women Tabs Take 1 tablet by mouth daily before breakfast.   clotrimazole-betamethasone cream Commonly known as: LOTRISONE Apply 1 application topically 2 (two) times daily as needed (skin irritation).   cyclobenzaprine 5 MG tablet Commonly known as: FLEXERIL Take 5 mg by mouth 2 (two) times daily.   eletriptan 20 MG tablet Commonly known as: RELPAX Take 20 mg by mouth as needed for migraine or headache. May repeat in 2 hours if headache persists or recurs.   furosemide 20 MG tablet Commonly known as: LASIX Take 20 mg by mouth daily.   Lurasidone HCl 120 MG Tabs Take 120 mg by mouth at bedtime.   Methylnaltrexone Bromide 150 MG Tabs Take 450 mg by mouth daily.   morphine 15 MG 12 hr tablet Commonly known as: MS CONTIN Take 15 mg by mouth 3 (three) times daily.   olmesartan 20 MG tablet Commonly known as: BENICAR Take 20 mg by mouth daily.   olopatadine 0.1 % ophthalmic solution Commonly known as: PATANOL Place 1 drop into both eyes 2 (two) times daily as needed for allergies.   omeprazole 20 MG capsule Commonly known as: PRILOSEC Take 20 mg by mouth 2 (two) times daily.   oxyCODONE-acetaminophen 7.5-325 MG tablet Commonly known as: PERCOCET Take 1 tablet by mouth every 6 (six) hours as needed for moderate pain or severe pain.   pregabalin 50 MG capsule Commonly known as: LYRICA Take 50 mg by mouth 2 (two) times daily.   zinc sulfate 220 (50 Zn) MG capsule Take 220 mg by mouth daily.   ziprasidone 40 MG capsule Commonly known as: GEODON Take 40 mg by mouth 2 (two) times daily with a meal.               Durable Medical Equipment  (From admission, onward)           Start     Ordered   09/06/20 1003  For home use only DME  Walker rolling  Once       Comments: Bariatric one  Question Answer Comment  Walker: With 5 Inch Wheels   Patient needs a walker to treat with the following condition Hip pain      09/06/20 1005            Follow-up Information     Cory Roughen, PA-C. Schedule an appointment as soon as possible for a visit.   Specialty: Internal Medicine Contact information: [address] [place]. [place] [address] [phone]                Allergies  Allergen Reactions   Latex Hives, Itching, Rash and Swelling   Atenolol Other (See Comments)   Diatrizoate Itching   Lamotrigine Other (See Comments)  Chest discomfort/ anxiety attack Chest discomfort/ anxiety attack    Lisinopril Other (See Comments)    Headaches, sick on stomach   Morphine And Related Other (See Comments)    "severe migraine"   Tramadol Other (See Comments)    "severe migraine"   Eggs Or Egg-Derived Products Diarrhea and Rash   Iodinated Diagnostic Agents Other (See Comments) and Rash    HIves, "I feel like I'm on fire"    Consultations: Orthopedic surgery.  Procedures/Studies: CT Lumbar Spine Wo Contrast  Result Date: 09/05/2020 CLINICAL DATA:  50 year old female with acute onset low back pain, left hip and knee pain. "Felt a pop" . EXAM: CT LUMBAR SPINE WITHOUT CONTRAST TECHNIQUE: Multidetector CT imaging of the lumbar spine was performed without intravenous contrast administration. Multiplanar CT image reconstructions were also generated. COMPARISON:  CT Abdomen and Pelvis 10/10/2010. FINDINGS: Segmentation: Normal. Alignment: Mild straightening of lumbar lordosis. Subtle retrolisthesis of L5 on S1. Vertebrae: No acute osseous abnormality identified. Bone mineralization is within normal limits. Mild lower thoracic endplate spurring. Mild chronic appearing irregularity of the superior L3 and L4 endplates, likely Schmorl's nodes. Intact visible sacrum and SI joints. Paraspinal and other  soft tissues: Chronic low-density benign left adrenal adenoma(s) have not significantly changed since 2012 on series 5, image 3. Negative visible right adrenal gland, spleen, pancreas, stomach, small bowel, bladder and uterus. Possible hepatic steatosis. Punctate left nephrolithiasis. No hydronephrosis. Diverticulosis in the descending and sigmoid colon. Normal caliber abdominal aorta. No lymphadenopathy. Lumbar paraspinal soft tissues are within normal limits. Disc levels: T11-T12: Mild vacuum disc. Anterior endplate spurring. Otherwise negative. T12-L1:  Anterior endplate spurring, otherwise negative. L1-L2:  Negative. L2-L3: Mild circumferential disc bulge. Mild facet hypertrophy. No stenosis. L3-L4: Mild circumferential disc bulge. Moderate facet hypertrophy. Trace vacuum facet on the right. But no convincing stenosis. L4-L5: Circumferential disc bulge. Moderate to severe facet hypertrophy with vacuum facet greater on the right. Mild spinal and bilateral L4 neural foraminal stenosis. L5-S1: Subtle retrolisthesis with mild circumferential disc bulge. Moderate facet hypertrophy. No convincing spinal stenosis. Mild to moderate bilateral L5 foraminal stenosis greater on the left. IMPRESSION: 1. No acute osseous abnormality in the lumbar spine. 2. Lumbar spine degeneration maximal at L4-L5 and L5-S1 with advanced facet degeneration. Mild spinal stenosis suspected at L4-L5. Up to moderate L5 neural foraminal stenosis. 3. Benign left adrenal adenoma. Punctate left nephrolithiasis. Diverticulosis of the descending and sigmoid colon. Electronically Signed   By: Odessa Fleming M.D.   On: 09/05/2020 05:03   CT Hip Left Wo Contrast  Result Date: 09/05/2020 CLINICAL DATA:  50 year old female with acute onset low back pain, left hip and knee pain. "Felt a pop" . EXAM: CT OF THE LEFT HIP WITHOUT CONTRAST TECHNIQUE: Multidetector CT imaging of the left hip was performed according to the standard protocol. Multiplanar CT image  reconstructions were also generated. COMPARISON:  Lumbar spine CT today reported separately. CT Abdomen and Pelvis 10/10/2010. FINDINGS: Diverticulosis of the sigmoid colon. Partially visible retained stool in the rectum and mildly distended urinary bladder. Incidental pelvic phleboliths. No left inguinal or pelvic sidewall lymphadenopathy. Left femoral head is normally located. Visible left hemipelvis and proximal left femur are intact. There is left hip joint space loss with femoral head osteophytosis, and anterior lip acetabular subchondral sclerosis and mild fragmentation. These changes are new or progressed since 2012. No regional soft tissue inflammation is evident. No acute osseous abnormality identified. IMPRESSION: Moderate left hip joint degeneration has progressed since 2012. No acute osseous abnormality identified.  Electronically Signed   By: Odessa Fleming M.D.   On: 09/05/2020 05:06   DG Knee Complete 4 Views Left  Result Date: 09/04/2020 CLINICAL DATA:  Pain after injury. EXAM: LEFT KNEE - COMPLETE 4+ VIEW COMPARISON:  None. FINDINGS: No evidence of fracture, dislocation, or joint effusion. No evidence of arthropathy or other focal bone abnormality. Soft tissues are unremarkable. IMPRESSION: Negative. Electronically Signed   By: Gerome Sam III M.D.   On: 09/04/2020 19:59   DG Hip Unilat With Pelvis 2-3 Views Left  Result Date: 09/04/2020 CLINICAL DATA:  Pain after injury. EXAM: DG HIP (WITH OR WITHOUT PELVIS) 2-3V LEFT COMPARISON:  None. FINDINGS: Mild degenerative changes in the hip with mild loss of superior joint space. No fracture or dislocation. No other acute abnormalities. IMPRESSION: Mild degenerative changes in the left hip with mild loss of superior joint space. No other acute abnormalities. Electronically Signed   By: Gerome Sam III M.D.   On: 09/04/2020 20:01    Subjective: Patient was seen and examined today.  Continues to have left hip pain.  No injuries.  Left hip is  bothering her for a long time.  History of chronic pain syndrome.  On opioids.  She was trying to get out of her second floor apartment for some time and asking if we can give her a letter to expedite the process.  I told her that she should contact her PCP. She was requesting a walker and bedside commode which was provided.  Discharge Exam: Vitals:   09/06/20 0452 09/06/20 0721  BP: 103/62 112/70  Pulse: (!) 108 (!) 104  Resp: 18 18  Temp: 98.3 F (36.8 C) 98.6 F (37 C)  SpO2: 99% 100%   Vitals:   09/05/20 1955 09/05/20 2125 09/06/20 0452 09/06/20 0721  BP: 128/74 138/83 103/62 112/70  Pulse: 91 93 (!) 108 (!) 104  Resp: 18 19 18 18   Temp: 98.4 F (36.9 C) 98.3 F (36.8 C) 98.3 F (36.8 C) 98.6 F (37 C)  TempSrc: Oral Oral    SpO2: 95% 98% 99% 100%  Weight:      Height:        General: Pt is alert, awake, not in acute distress Cardiovascular: RRR, S1/S2 +, no rubs, no gallops Respiratory: CTA bilaterally, no wheezing, no rhonchi Abdominal: Soft, NT, ND, bowel sounds + Extremities: no edema, no cyanosis   The results of significant diagnostics from this hospitalization (including imaging, microbiology, ancillary and laboratory) are listed below for reference.    Microbiology: Recent Results (from the past 240 hour(s))  Resp Panel by RT-PCR (Flu A&B, Covid) Nasopharyngeal Swab     Status: None   Collection Time: 09/05/20  8:13 PM   Specimen: Nasopharyngeal Swab; Nasopharyngeal(NP) swabs in vial transport medium  Result Value Ref Range Status   SARS Coronavirus 2 by RT PCR NEGATIVE NEGATIVE Final    Comment: (NOTE) SARS-CoV-2 target nucleic acids are NOT DETECTED.  The SARS-CoV-2 RNA is generally detectable in upper respiratory specimens during the acute phase of infection. The lowest concentration of SARS-CoV-2 viral copies this assay can detect is 138 copies/mL. A negative result does not preclude SARS-Cov-2 infection and should not be used as the sole basis for  treatment or other patient management decisions. A negative result may occur with  improper specimen collection/handling, submission of specimen other than nasopharyngeal swab, presence of viral mutation(s) within the areas targeted by this assay, and inadequate number of viral copies(<138 copies/mL). A negative result  must be combined with clinical observations, patient history, and epidemiological information. The expected result is Negative.  Fact Sheet for Patients:  BloggerCourse.com  Fact Sheet for Healthcare Providers:  SeriousBroker.it  This test is no t yet approved or cleared by the [place] FDA and  has been authorized for detection and/or diagnosis of SARS-CoV-2 by FDA under an Emergency Use Authorization (EUA). This EUA will remain  in effect (meaning this test can be used) for the duration of the COVID-19 declaration under Section 564(b)(1) of the Act, 21 U.S.C.section 360bbb-3(b)(1), unless the authorization is terminated  or revoked sooner.       Influenza A by PCR NEGATIVE NEGATIVE Final   Influenza B by PCR NEGATIVE NEGATIVE Final    Comment: (NOTE) The Xpert Xpress SARS-CoV-2/FLU/RSV plus assay is intended as an aid in the diagnosis of influenza from Nasopharyngeal swab specimens and should not be used as a sole basis for treatment. Nasal washings and aspirates are unacceptable for Xpert Xpress SARS-CoV-2/FLU/RSV testing.  Fact Sheet for Patients: BloggerCourse.com  Fact Sheet for Healthcare Providers: SeriousBroker.it  This test is not yet approved or cleared by the [place] FDA and has been authorized for detection and/or diagnosis of SARS-CoV-2 by FDA under an Emergency Use Authorization (EUA). This EUA will remain in effect (meaning this test can be used) for the duration of the COVID-19 declaration under Section 564(b)(1) of the Act, 21  U.S.C. section 360bbb-3(b)(1), unless the authorization is terminated or revoked.  Performed at [facility], [address] [place], [place] [address]      Labs: BNP (last 3 results) No results for input(s): BNP in the last 8760 hours. Basic Metabolic Panel: Recent Labs  Lab 09/05/20 0248 09/06/20 0403  NA 139 138  K 3.8 3.8  CL 105 104  CO2 27 25  GLUCOSE 146* 131*  BUN 17 18  CREATININE 1.18* 0.85  CALCIUM 8.9 9.1   Liver Function Tests: No results for input(s): AST, ALT, ALKPHOS, BILITOT, PROT, ALBUMIN in the last 168 hours. No results for input(s): LIPASE, AMYLASE in the last 168 hours. No results for input(s): AMMONIA in the last 168 hours. CBC: Recent Labs  Lab 09/05/20 0248  WBC 9.0  NEUTROABS 5.3  HGB 11.2*  HCT 35.8*  MCV 85.6  PLT 263   Cardiac Enzymes: No results for input(s): CKTOTAL, CKMB, CKMBINDEX, TROPONINI in the last 168 hours. BNP: Invalid input(s): POCBNP CBG: No results for input(s): GLUCAP in the last 168 hours. D-Dimer No results for input(s): DDIMER in the last 72 hours. Hgb A1c No results for input(s): HGBA1C in the last 72 hours. Lipid Profile No results for input(s): CHOL, HDL, LDLCALC, TRIG, CHOLHDL, LDLDIRECT in the last 72 hours. Thyroid function studies No results for input(s): TSH, T4TOTAL, T3FREE, THYROIDAB in the last 72 hours.  Invalid input(s): FREET3 Anemia work up No results for input(s): VITAMINB12, FOLATE, FERRITIN, TIBC, IRON, RETICCTPCT in the last 72 hours. Urinalysis No results found for: COLORURINE, APPEARANCEUR, LABSPEC, PHURINE, GLUCOSEU, HGBUR, BILIRUBINUR, KETONESUR, PROTEINUR, UROBILINOGEN, NITRITE, LEUKOCYTESUR Sepsis Labs Invalid input(s): PROCALCITONIN,  WBC,  LACTICIDVEN Microbiology Recent Results (from the past 240 hour(s))  Resp Panel by RT-PCR (Flu A&B, Covid) Nasopharyngeal Swab     Status: None   Collection Time: 09/05/20  8:13 PM   Specimen: Nasopharyngeal Swab;  Nasopharyngeal(NP) swabs in vial transport medium  Result Value Ref Range Status   SARS Coronavirus 2 by RT PCR NEGATIVE NEGATIVE Final    Comment: (NOTE) SARS-CoV-2 target  nucleic acids are NOT DETECTED.  The SARS-CoV-2 RNA is generally detectable in upper respiratory specimens during the acute phase of infection. The lowest concentration of SARS-CoV-2 viral copies this assay can detect is 138 copies/mL. A negative result does not preclude SARS-Cov-2 infection and should not be used as the sole basis for treatment or other patient management decisions. A negative result may occur with  improper specimen collection/handling, submission of specimen other than nasopharyngeal swab, presence of viral mutation(s) within the areas targeted by this assay, and inadequate number of viral copies(<138 copies/mL). A negative result must be combined with clinical observations, patient history, and epidemiological information. The expected result is Negative.  Fact Sheet for Patients:  BloggerCourse.comhttps://www.fda.gov/media/152166/download  Fact Sheet for Healthcare Providers:  SeriousBroker.ithttps://www.fda.gov/media/152162/download  This test is no t yet approved or cleared by the [place] FDA and  has been authorized for detection and/or diagnosis of SARS-CoV-2 by FDA under an Emergency Use Authorization (EUA). This EUA will remain  in effect (meaning this test can be used) for the duration of the COVID-19 declaration under Section 564(b)(1) of the Act, 21 U.S.C.section 360bbb-3(b)(1), unless the authorization is terminated  or revoked sooner.       Influenza A by PCR NEGATIVE NEGATIVE Final   Influenza B by PCR NEGATIVE NEGATIVE Final    Comment: (NOTE) The Xpert Xpress SARS-CoV-2/FLU/RSV plus assay is intended as an aid in the diagnosis of influenza from Nasopharyngeal swab specimens and should not be used as a sole basis for treatment. Nasal washings and aspirates are unacceptable for Xpert Xpress  SARS-CoV-2/FLU/RSV testing.  Fact Sheet for Patients: BloggerCourse.comhttps://www.fda.gov/media/152166/download  Fact Sheet for Healthcare Providers: SeriousBroker.ithttps://www.fda.gov/media/152162/download  This test is not yet approved or cleared by the [place] FDA and has been authorized for detection and/or diagnosis of SARS-CoV-2 by FDA under an Emergency Use Authorization (EUA). This EUA will remain in effect (meaning this test can be used) for the duration of the COVID-19 declaration under Section 564(b)(1) of the Act, 21 U.S.C. section 360bbb-3(b)(1), unless the authorization is terminated or revoked.  Performed at [facility], [address] [place], [place] [id number]     Time coordinating discharge: Over 30 minutes  SIGNED:  Arnetha CourserSumayya Hurshel Bouillon, MD  [facility] 09/06/2020, 10:07 AM  If 7PM-7AM, please contact night-coverage [email or web address]  This record has been created using Conservation officer, historic buildingsDragon voice recognition software. Errors have been sought and corrected,but may not always be located. Such creation errors do not reflect on the standard of care.

## 2020-09-06 NOTE — Evaluation (Signed)
Physical Therapy Evaluation Patient Details Name: Nicole Logan MRN: [id number] DOB: 10/29/1970 Today's Date: 09/06/2020   History of Present Illness  50 y.o. female with PMH as noted below as well as HIV, hypertension, type 2 diabetes, NASH, peripheral neuropathy, IBS, migraines, hypothyroidism, hypercholesterolemia, hepatitis, and ADHD who presents with left hip and leg pain, acute onset when the patient was standing and felt/heard a pop in her left knee (apparently reported as hip in ED?).  She states it felt like she got kicked by a [place].  She sat backwards, pastor assisted back to standing and she tried to take 3 or 4 steps but could not bear weight and collapsed to the ground.  Clinical Impression  Pt with very poor tolerance to any activity with the L LE, especially with the knee.  She does not have any overt L knee swelling and CT was negative, however she c/o exquisite pain with even minimal palpation and tolerated very limited PROM with no AROM in knee or hip.  Pt with good R LE movement.  Deferred even getting to sitting EOB 2/2 severity of pain with so little on the L.  Pt lives in 2nd floor apartment with no elevator and states that even prior to this incident she struggled with home management, in/out of home and generally was not doing particularly well.  Discussed the likely need to pursue a new baseline setting, but per today's performance (or lack-there-of) she is clearly unsafe at home and I am recommending STR when medically cleared for d/c.    Follow Up Recommendations SNF;Supervision/Assistance - 24 hour    Equipment Recommendations  Rolling walker with 5" wheels;3in1 (PT) (bariatric size/weight)    Recommendations for Other Services       Precautions / Restrictions Precautions Precautions: Fall Restrictions Weight Bearing Restrictions: No Other Position/Activity Restrictions: Pt with essentially self imposed NWBing 2/2 fear/pain      Mobility  Bed Mobility                General bed mobility comments: Pt calling out in pain with even minimal gentle L knee PROM, unable to tolerate attempts to sitting EOB.  Pt with significant aparent anxiety.    Transfers                    Ambulation/Gait                Stairs            Wheelchair Mobility    Modified Rankin (Stroke Patients Only)       Balance Overall balance assessment: History of Falls                                           Pertinent Vitals/Pain Pain Assessment: 0-10 Pain Score: 10-Worst pain ever Pain Location: Pt unable to pin-point pain, front of knee vs back of knee vs deep in the knee as well as considerable but less severe L hip, again with variable lateral/posterior/groin pain.  Hyper-responsive with c/o severe pain with any movement    Home Living Family/patient expects to be discharged to:: Private residence Living Arrangements: Alone Available Help at Discharge:  (pt reports very limited assist, no family close) Type of Home: Apartment Home Access: Stairs to enter Entrance Stairs-Rails:  (yes) Entrance Stairs-Number of Steps: flight   Home Equipment: Walker - 4 wheels;Cane - single  point      Prior Function Level of Independence: Needs assistance   Gait / Transfers Assistance Needed: Apparently pt has recently been struggling to even stand a few minutes in the home, out of home 1-2x/month with increasing difficulty managing steps and having days where she can not even get up...  ADL's / Homemaking Assistance Needed: Pt reports she has struggled recently with upkeep of home and herself.        Hand Dominance        Extremity/Trunk Assessment   Upper Extremity Assessment Upper Extremity Assessment: Generalized weakness (R shoulder elevation pain limited, grossly 3+/5)    Lower Extremity Assessment Lower Extremity Assessment: LLE deficits/detail LLE Deficits / Details: Pt with no AROM in L LE apart from  ankle, tolerates only ~10-40 degrees with severe pain with any movement.  Attempted to try ligament laxity testing with pt c/o too much pain with even minimal palpation, diffiicult to differentiate body habitus vs swelling, but does not seem to have excessive acute fluid/bruising. LLE: Unable to fully assess due to pain LLE Sensation:  (reports some vague numbness/shooting pain down L LE procximal and distal to the knee)       Communication   Communication: No difficulties  Cognition Arousal/Alertness: Awake/alert Behavior During Therapy: Anxious Overall Cognitive Status: Within Functional Limits for tasks assessed                                        General Comments General comments (skin integrity, edema, etc.): Pt extremely pain limited/hesitant to do anything with L LE, knee>hip.  She apparently has been struggling to manage at home even prior to this fall - does not appear to have safe d/c options re: family/neighbor/etc assist    Exercises     Assessment/Plan    PT Assessment Patient needs continued PT services  PT Problem List Decreased strength;Decreased range of motion;Decreased activity tolerance;Decreased balance;Decreased mobility;Decreased knowledge of use of DME;Decreased safety awareness;Pain;Obesity       PT Treatment Interventions DME instruction;Gait training;Stair training;Functional mobility training;Therapeutic activities;Therapeutic exercise;Balance training;Neuromuscular re-education;Patient/family education    PT Goals (Current goals can be found in the Care Plan section)  Acute Rehab PT Goals Patient Stated Goal: control L knee and hip pain PT Goal Formulation: With patient Time For Goal Achievement: 09/20/20 Potential to Achieve Goals: Fair    Frequency Min 2X/week   Barriers to discharge Decreased caregiver support;Inaccessible home environment      Co-evaluation               AM-PAC PT "6 Clicks" Mobility  Outcome  Measure Help needed turning from your back to your side while in a flat bed without using bedrails?: A Lot Help needed moving from lying on your back to sitting on the side of a flat bed without using bedrails?: A Lot Help needed moving to and from a bed to a chair (including a wheelchair)?: Total Help needed standing up from a chair using your arms (e.g., wheelchair or bedside chair)?: Total Help needed to walk in hospital room?: Total Help needed climbing 3-5 steps with a railing? : Total 6 Click Score: 8    End of Session   Activity Tolerance: Patient limited by pain Patient left: with bed alarm set;with call bell/phone within reach Nurse Communication: Mobility status (possible ortho consult) PT Visit Diagnosis: Difficulty in walking, not elsewhere classified (R26.2);Pain;Muscle weakness (generalized) (  M62.81);Unsteadiness on feet (R26.81) Pain - Right/Left: Left Pain - part of body: Knee    Time: 4128-7867 PT Time Calculation (min) (ACUTE ONLY): 34 min   Charges:   PT Evaluation $PT Eval Moderate Complexity: 1 Mod          Malachi Pro, DPT 09/06/2020, 10:16 AM

## 2020-09-06 NOTE — Consult Note (Signed)
ORTHOPAEDIC CONSULTATION  REQUESTING PHYSICIAN: Arnetha Courser, MD  Chief Complaint: Left knee pain status post fall  HPI: Nicole Logan is a 50 y.o. female with a history of complex regional pain syndrome, on OxyContin, oxycodone and Lyrica, who presented to the hospital on 09/04/2020 for pain in her left knee.  In her hospital room today the patient explains that she had an episode where her left knee gave way with a loud pop while she was talking to her preacher in the driveway.  The preacher caught her before she fell.  Patient then drove back to [place] from [place].  She explains that she was having left knee pain in the car.  Once the patient parked her car she describes taking 3 steps before she states her left knee gave out and she fell backwards and was unable to get up.  She was brought to the [facility] emergency department by EMS.  Patient states she continues to have significant pain in the left knee limiting her range of motion.  She states that she has not yet been able to weight-bear on the left lower extremity since admission.   Past Medical History:  Diagnosis Date   CRPS (complex regional pain syndrome type I)    Hypertension    Past Surgical History:  Procedure Laterality Date   LITHOTRIPSY     TONSILLECTOMY     Social History   Socioeconomic History   Marital status: Divorced    Spouse name: Not on file   Number of children: Not on file   Years of education: Not on file   Highest education level: Not on file  Occupational History   Not on file  Tobacco Use   Smoking status: Former    Packs/day: 1.00    Types: Cigarettes   Smokeless tobacco: Never  Vaping Use   Vaping Use: Never used  Substance and Sexual Activity   Alcohol use: Not Currently   Drug use: Never   Sexual activity: Not on file  Other Topics Concern   Not on file  Social History Narrative   Not on file   Social Determinants of Health   Financial Resource Strain: Not on  file  Food Insecurity: Not on file  Transportation Needs: Not on file  Physical Activity: Not on file  Stress: Not on file  Social Connections: Not on file   Family History  Problem Relation Age of Onset   Thyroid disease Mother    Kidney disease Mother    Thyroid disease Sister    Allergies  Allergen Reactions   Latex Hives, Itching, Rash and Swelling   Atenolol Other (See Comments)   Diatrizoate Itching   Lamotrigine Other (See Comments)    Chest discomfort/ anxiety attack Chest discomfort/ anxiety attack    Lisinopril Other (See Comments)    Headaches, sick on stomach   Morphine And Related Other (See Comments)    "severe migraine"   Tramadol Other (See Comments)    "severe migraine"   Eggs Or Egg-Derived Products Diarrhea and Rash   Iodinated Diagnostic Agents Other (See Comments) and Rash    HIves, "I feel like I'm on fire"   Prior to Admission medications   Medication Sig Start Date End Date Taking? Authorizing Provider  albuterol (VENTOLIN HFA) 108 (90 Base) MCG/ACT inhaler Inhale 2 puffs into the lungs every 6 (six) hours as needed for wheezing or shortness of breath.   Yes [provider]  allopurinol (ZYLOPRIM) 300 MG tablet Take  300 mg by mouth daily.   Yes [provider]  ALPRAZolam Prudy Feeler) 1 MG tablet Take 1 mg by mouth 3 (three) times daily as needed for anxiety.   Yes [provider]  amphetamine-dextroamphetamine (ADDERALL XR) 30 MG 24 hr capsule Take 30 mg by mouth 2 (two) times daily.   Yes [provider]  bictegravir-emtricitabine-tenofovir AF (BIKTARVY) 50-200-25 MG TABS tablet Take 1 tablet by mouth at bedtime.   Yes [provider]  clotrimazole-betamethasone (LOTRISONE) cream Apply 1 application topically 2 (two) times daily as needed (skin irritation).   Yes [provider]  cyclobenzaprine (FLEXERIL) 5 MG tablet Take 5 mg by mouth 2 (two) times daily.   Yes [provider]  eletriptan  (RELPAX) 20 MG tablet Take 20 mg by mouth as needed for migraine or headache. May repeat in 2 hours if headache persists or recurs.   Yes [provider]  furosemide (LASIX) 20 MG tablet Take 20 mg by mouth daily.   Yes [provider]  Lurasidone HCl 120 MG TABS Take 120 mg by mouth at bedtime.   Yes [provider]  Methylnaltrexone Bromide 150 MG TABS Take 450 mg by mouth daily.   Yes [provider]  morphine (MS CONTIN) 15 MG 12 hr tablet Take 15 mg by mouth 3 (three) times daily.   Yes [provider]  Multiple Vitamins-Minerals (CENTRUM SILVER 50+WOMEN) TABS Take 1 tablet by mouth daily before breakfast.   Yes [provider]  olmesartan (BENICAR) 20 MG tablet Take 20 mg by mouth daily.   Yes [provider]  olopatadine (PATANOL) 0.1 % ophthalmic solution Place 1 drop into both eyes 2 (two) times daily as needed for allergies.   Yes [provider]  omeprazole (PRILOSEC) 20 MG capsule Take 20 mg by mouth 2 (two) times daily.   Yes [provider]  oxyCODONE-acetaminophen (PERCOCET) 7.5-325 MG tablet Take 1 tablet by mouth every 6 (six) hours as needed for moderate pain or severe pain. 08/12/20  Yes [provider]  pregabalin (LYRICA) 50 MG capsule Take 50 mg by mouth 2 (two) times daily.   Yes [provider]  zinc sulfate 220 (50 Zn) MG capsule Take 220 mg by mouth daily.   Yes [provider]  ziprasidone (GEODON) 40 MG capsule Take 40 mg by mouth 2 (two) times daily with a meal.   Yes [provider]   CT Lumbar Spine Wo Contrast  Result Date: 09/05/2020 CLINICAL DATA:  49 year old female with acute onset low back pain, left hip and knee pain. "Felt a pop" . EXAM: CT LUMBAR SPINE WITHOUT CONTRAST TECHNIQUE: Multidetector CT imaging of the lumbar spine was performed without intravenous contrast administration. Multiplanar CT image reconstructions were also generated.  COMPARISON:  CT Abdomen and Pelvis 10/10/2010. FINDINGS: Segmentation: Normal. Alignment: Mild straightening of lumbar lordosis. Subtle retrolisthesis of L5 on S1. Vertebrae: No acute osseous abnormality identified. Bone mineralization is within normal limits. Mild lower thoracic endplate spurring. Mild chronic appearing irregularity of the superior L3 and L4 endplates, likely Schmorl's nodes. Intact visible sacrum and SI joints. Paraspinal and other soft tissues: Chronic low-density benign left adrenal adenoma(s) have not significantly changed since 2012 on series 5, image 3. Negative visible right adrenal gland, spleen, pancreas, stomach, small bowel, bladder and uterus. Possible hepatic steatosis. Punctate left nephrolithiasis. No hydronephrosis. Diverticulosis in the descending and sigmoid colon. Normal caliber abdominal aorta. No lymphadenopathy. Lumbar paraspinal soft tissues are within normal limits.  Disc levels: T11-T12: Mild vacuum disc. Anterior endplate spurring. Otherwise negative. T12-L1:  Anterior endplate spurring, otherwise negative. L1-L2:  Negative. L2-L3: Mild circumferential disc bulge. Mild facet hypertrophy. No stenosis. L3-L4: Mild circumferential disc bulge. Moderate facet hypertrophy. Trace vacuum facet on the right. But no convincing stenosis. L4-L5: Circumferential disc bulge. Moderate to severe facet hypertrophy with vacuum facet greater on the right. Mild spinal and bilateral L4 neural foraminal stenosis. L5-S1: Subtle retrolisthesis with mild circumferential disc bulge. Moderate facet hypertrophy. No convincing spinal stenosis. Mild to moderate bilateral L5 foraminal stenosis greater on the left. IMPRESSION: 1. No acute osseous abnormality in the lumbar spine. 2. Lumbar spine degeneration maximal at L4-L5 and L5-S1 with advanced facet degeneration. Mild spinal stenosis suspected at L4-L5. Up to moderate L5 neural foraminal stenosis. 3. Benign left adrenal adenoma. Punctate left  nephrolithiasis. Diverticulosis of the descending and sigmoid colon. Electronically Signed   By: Odessa Fleming M.D.   On: 09/05/2020 05:03   CT Hip Left Wo Contrast  Result Date: 09/05/2020 CLINICAL DATA:  50 year old female with acute onset low back pain, left hip and knee pain. "Felt a pop" . EXAM: CT OF THE LEFT HIP WITHOUT CONTRAST TECHNIQUE: Multidetector CT imaging of the left hip was performed according to the standard protocol. Multiplanar CT image reconstructions were also generated. COMPARISON:  Lumbar spine CT today reported separately. CT Abdomen and Pelvis 10/10/2010. FINDINGS: Diverticulosis of the sigmoid colon. Partially visible retained stool in the rectum and mildly distended urinary bladder. Incidental pelvic phleboliths. No left inguinal or pelvic sidewall lymphadenopathy. Left femoral head is normally located. Visible left hemipelvis and proximal left femur are intact. There is left hip joint space loss with femoral head osteophytosis, and anterior lip acetabular subchondral sclerosis and mild fragmentation. These changes are new or progressed since 2012. No regional soft tissue inflammation is evident. No acute osseous abnormality identified. IMPRESSION: Moderate left hip joint degeneration has progressed since 2012. No acute osseous abnormality identified. Electronically Signed   By: Odessa Fleming M.D.   On: 09/05/2020 05:06   DG Knee Complete 4 Views Left  Result Date: 09/04/2020 CLINICAL DATA:  Pain after injury. EXAM: LEFT KNEE - COMPLETE 4+ VIEW COMPARISON:  None. FINDINGS: No evidence of fracture, dislocation, or joint effusion. No evidence of arthropathy or other focal bone abnormality. Soft tissues are unremarkable. IMPRESSION: Negative. Electronically Signed   By: Gerome Sam III M.D.   On: 09/04/2020 19:59   DG Hip Unilat With Pelvis 2-3 Views Left  Result Date: 09/04/2020 CLINICAL DATA:  Pain after injury. EXAM: DG HIP (WITH OR WITHOUT PELVIS) 2-3V LEFT COMPARISON:  None.  FINDINGS: Mild degenerative changes in the hip with mild loss of superior joint space. No fracture or dislocation. No other acute abnormalities. IMPRESSION: Mild degenerative changes in the left hip with mild loss of superior joint space. No other acute abnormalities. Electronically Signed   By: Gerome Sam III M.D.   On: 09/04/2020 20:01    Positive ROS: All other systems have been reviewed and were otherwise negative with the exception of those mentioned in the HPI and as above.  Physical Exam: General: Alert, no acute distress.  Patient is lying in bed in no acute distress.  MUSCULOSKELETAL: Left knee: Skin overlying the left knee is intact.  There are no abrasions.  The patient has no erythema ecchymosis or obvious effusion.  Her leg and thigh compartments are soft and compressible.  She has palpable pedal pulses and intact sensation light touch throughout  the left lower extremity.  She can flex and extend her toes and dorsiflex and plantarflex her left ankle.  Patient was able to flex her knee actively to approximately 20 to 30 degrees and actively extend with mild to moderate pain.  Patient had tenderness over the medial joint line greater than lateral joint line.  She demonstrated no instability to Lachman's or varus or valgus stress testing in extension.  Assessment: Left knee pain status post fall  Plan: Patient had the acute onset of left knee pain when her knee gave way twice causing her to fall once.  Patient was unable to get up and weight-bear and was brought to [facility].  She is not exhibiting ligamentous laxity on exam but has tenderness to palpation around the knee.  Her knee range of motion is limited.  Patient felt a pop on Sunday as her knee gave out suggesting the possibility for ligamentous injury versus meniscal tear.  Patient is being ordered for an MRI to evaluate for ligamentous injury, meniscal tear, chondral injury or occult  fracture.  Patient's left hip x-rays and CT demonstrate moderate to advanced degenerative changes in the left hip with joint space narrowing and marginal osteophytes.  Her left knee films demonstrate no fracture or dislocation.  There is no patella alta or B[place].  She is mild medial compartment joint space narrowing.  CT scan of the left hip demonstrates no evidence of fracture.  CT scan of the lumbar spine is no osseous abnormality.  Patient had spondylosis at L4-5 and L5-S1 with advanced facet arthrosis.  She had mild spinal stenosis suspected at L4-5 and moderate L5 neuroforaminal stenosis.   I explained to the patient that an MRI has been ordered of her left knee to evaluate for occult fracture or soft tissue injury.  I will follow-up with her after this test to be completed.  Physical and Occupational Therapy should be placed on hold until the MRI results are available.  I have reviewed the MRI results when the test is completed and make further recommendations at that time.  Continue current pain management.   Juanell FairlyKevin Lynell Greenhouse, MD    09/06/2020 7:23 PM

## 2020-09-07 DIAGNOSIS — S83512A Sprain of anterior cruciate ligament of left knee, initial encounter: Secondary | ICD-10-CM | POA: Diagnosis present

## 2020-09-07 DIAGNOSIS — W19XXXA Unspecified fall, initial encounter: Secondary | ICD-10-CM | POA: Diagnosis present

## 2020-09-07 DIAGNOSIS — M25552 Pain in left hip: Secondary | ICD-10-CM | POA: Diagnosis present

## 2020-09-07 DIAGNOSIS — M5432 Sciatica, left side: Secondary | ICD-10-CM | POA: Diagnosis present

## 2020-09-07 DIAGNOSIS — Z79899 Other long term (current) drug therapy: Secondary | ICD-10-CM | POA: Diagnosis not present

## 2020-09-07 DIAGNOSIS — Z6841 Body Mass Index (BMI) 40.0 and over, adult: Secondary | ICD-10-CM | POA: Diagnosis not present

## 2020-09-07 DIAGNOSIS — K589 Irritable bowel syndrome without diarrhea: Secondary | ICD-10-CM | POA: Diagnosis present

## 2020-09-07 DIAGNOSIS — F419 Anxiety disorder, unspecified: Secondary | ICD-10-CM | POA: Diagnosis present

## 2020-09-07 DIAGNOSIS — Z20822 Contact with and (suspected) exposure to covid-19: Secondary | ICD-10-CM | POA: Diagnosis present

## 2020-09-07 DIAGNOSIS — S86812A Strain of other muscle(s) and tendon(s) at lower leg level, left leg, initial encounter: Secondary | ICD-10-CM | POA: Diagnosis present

## 2020-09-07 DIAGNOSIS — M25562 Pain in left knee: Secondary | ICD-10-CM | POA: Diagnosis present

## 2020-09-07 DIAGNOSIS — I10 Essential (primary) hypertension: Secondary | ICD-10-CM | POA: Diagnosis present

## 2020-09-07 DIAGNOSIS — M5136 Other intervertebral disc degeneration, lumbar region: Secondary | ICD-10-CM | POA: Diagnosis present

## 2020-09-07 DIAGNOSIS — K7581 Nonalcoholic steatohepatitis (NASH): Secondary | ICD-10-CM | POA: Diagnosis present

## 2020-09-07 DIAGNOSIS — E1142 Type 2 diabetes mellitus with diabetic polyneuropathy: Secondary | ICD-10-CM | POA: Diagnosis present

## 2020-09-07 DIAGNOSIS — E7800 Pure hypercholesterolemia, unspecified: Secondary | ICD-10-CM | POA: Diagnosis present

## 2020-09-07 DIAGNOSIS — M48061 Spinal stenosis, lumbar region without neurogenic claudication: Secondary | ICD-10-CM | POA: Diagnosis present

## 2020-09-07 DIAGNOSIS — G9059 Complex regional pain syndrome I of other specified site: Secondary | ICD-10-CM | POA: Diagnosis not present

## 2020-09-07 DIAGNOSIS — N179 Acute kidney failure, unspecified: Secondary | ICD-10-CM | POA: Diagnosis not present

## 2020-09-07 DIAGNOSIS — E039 Hypothyroidism, unspecified: Secondary | ICD-10-CM | POA: Diagnosis present

## 2020-09-07 DIAGNOSIS — S86112A Strain of other muscle(s) and tendon(s) of posterior muscle group at lower leg level, left leg, initial encounter: Secondary | ICD-10-CM | POA: Diagnosis present

## 2020-09-07 DIAGNOSIS — S83282A Other tear of lateral meniscus, current injury, left knee, initial encounter: Secondary | ICD-10-CM | POA: Diagnosis present

## 2020-09-07 DIAGNOSIS — Y92009 Unspecified place in unspecified non-institutional (private) residence as the place of occurrence of the external cause: Secondary | ICD-10-CM | POA: Diagnosis not present

## 2020-09-07 DIAGNOSIS — S83412A Sprain of medial collateral ligament of left knee, initial encounter: Secondary | ICD-10-CM | POA: Diagnosis present

## 2020-09-07 DIAGNOSIS — Z79891 Long term (current) use of opiate analgesic: Secondary | ICD-10-CM | POA: Diagnosis not present

## 2020-09-07 DIAGNOSIS — G9050 Complex regional pain syndrome I, unspecified: Secondary | ICD-10-CM | POA: Diagnosis present

## 2020-09-07 DIAGNOSIS — Z21 Asymptomatic human immunodeficiency virus [HIV] infection status: Secondary | ICD-10-CM | POA: Diagnosis present

## 2020-09-07 DIAGNOSIS — R296 Repeated falls: Secondary | ICD-10-CM | POA: Diagnosis present

## 2020-09-07 DIAGNOSIS — F32A Depression, unspecified: Secondary | ICD-10-CM | POA: Diagnosis present

## 2020-09-07 LAB — HELPER T-LYMPH-CD4 (ARMC ONLY)
% CD 4 Pos. Lymph.: 67.4 % — ABNORMAL HIGH (ref 30.8–58.5)
Absolute CD 4 Helper: 1618 /uL — ABNORMAL HIGH (ref 359–1519)
Basophils Absolute: 0.1 10*3/uL (ref 0.0–0.2)
Basos: 1 %
EOS (ABSOLUTE): 0.1 10*3/uL (ref 0.0–0.4)
Eos: 1 %
Hematocrit: 36.4 % (ref 34.0–46.6)
Hemoglobin: 11.2 g/dL (ref 11.1–15.9)
Immature Grans (Abs): 0 10*3/uL (ref 0.0–0.1)
Immature Granulocytes: 0 %
Lymphocytes Absolute: 2.4 10*3/uL (ref 0.7–3.1)
Lymphs: 31 %
MCH: 26.2 pg — ABNORMAL LOW (ref 26.6–33.0)
MCHC: 30.8 g/dL — ABNORMAL LOW (ref 31.5–35.7)
MCV: 85 fL (ref 79–97)
Monocytes Absolute: 0.7 10*3/uL (ref 0.1–0.9)
Monocytes: 10 %
Neutrophils Absolute: 4.4 10*3/uL (ref 1.4–7.0)
Neutrophils: 57 %
Platelets: 276 10*3/uL (ref 150–450)
RBC: 4.28 x10E6/uL (ref 3.77–5.28)
RDW: 16.2 % — ABNORMAL HIGH (ref 11.7–15.4)
WBC: 7.7 10*3/uL (ref 3.4–10.8)

## 2020-09-07 LAB — HIV-1/2 AB - DIFFERENTIATION
HIV 1 Ab: REACTIVE
HIV 2 Ab: NONREACTIVE

## 2020-09-07 LAB — HCV RNA QUANT RFLX ULTRA OR GENOTYP
HCV RNA Qnt(log copy/mL): UNDETERMINED log10 IU/mL
HepC Qn: NOT DETECTED IU/mL

## 2020-09-07 MED ORDER — MORPHINE ER 15 MG TABLET,EXTENDED RELEASE
ORAL_TABLET | 0 refills | 0 days | Status: CP
Start: 2020-09-07 — End: ?

## 2020-09-07 MED ORDER — OXYCODONE-ACETAMINOPHEN 7.5 MG-325 MG TABLET
ORAL_TABLET | 0 refills | 0 days | Status: CP
Start: 2020-09-07 — End: ?

## 2020-09-07 MED ORDER — POLYETHYLENE GLYCOL 3350 17 G PO PACK
17.0000 g | PACK | Freq: Two times a day (BID) | ORAL | Status: DC
  Administered 2020-09-08 – 2020-09-16 (×11): 17 g via ORAL
  Filled 2020-09-07 (×13): qty 1

## 2020-09-07 MED ORDER — MORPHINE SULFATE ER 15 MG PO TBCR
15.0000 mg | EXTENDED_RELEASE_TABLET | Freq: Three times a day (TID) | ORAL | Status: DC | PRN
Start: 2020-09-07 — End: 2020-09-17
  Administered 2020-09-07 – 2020-09-16 (×20): 15 mg via ORAL
  Filled 2020-09-07 (×20): qty 1

## 2020-09-07 MED ORDER — OXYCODONE-ACETAMINOPHEN 7.5-325 MG PO TABS
1.0000 | ORAL_TABLET | Freq: Four times a day (QID) | ORAL | Status: DC | PRN
  Administered 2020-09-07 – 2020-09-09 (×6): 1 via ORAL
  Filled 2020-09-07 (×6): qty 1

## 2020-09-07 MED ORDER — MORPHINE SULFATE ER 15 MG PO TBCR: 15.0000 mg | EXTENDED_RELEASE_TABLET | Freq: Two times a day (BID) | ORAL | Status: DC

## 2020-09-07 MED ORDER — NALOXEGOL OXALATE 25 MG PO TABS
25.0000 mg | ORAL_TABLET | Freq: Every day | ORAL | Status: DC
  Administered 2020-09-07 – 2020-09-16 (×10): 25 mg via ORAL
  Filled 2020-09-07 (×10): qty 1

## 2020-09-07 MED ORDER — PREGABALIN 75 MG PO CAPS
75.0000 mg | ORAL_CAPSULE | Freq: Three times a day (TID) | ORAL | Status: DC
  Administered 2020-09-07 – 2020-09-16 (×28): 75 mg via ORAL
  Filled 2020-09-07 (×28): qty 1

## 2020-09-07 NOTE — TOC Progression Note (Signed)
Transition of Care [facility]) - Progression Note    Patient Details  Name: Nicole Logan MRN: [id number] Date of Birth: 1970/07/10  Transition of [facility]) CM/SW Contact  Caryn Section, RN Phone Number: 09/07/2020, 3:14 PM  Clinical Narrative:   Patient states she lives on the 2nd floor apartment and will not be able to navigate the stairs.  Patient consents to SNF workup in [place] area.         Expected Discharge Plan and Services           Expected Discharge Date: 09/06/20                                     Social Determinants of Health (SDOH) Interventions    Readmission Risk Interventions No flowsheet data found.

## 2020-09-07 NOTE — Plan of Care (Signed)

## 2020-09-07 NOTE — Progress Notes (Signed)
PT Cancellation Note  Patient Details Name: Nicole Logan MRN: [id number] DOB: [birth year]   Cancelled Treatment:    Reason Eval/Treat Not Completed: Medical issues which prohibited therapy Per ortho recs will hold services until results of MRI are available and further recs made.  Will follow at a distance for now.  Malachi Pro, DPT 09/07/2020, 8:01 AM

## 2020-09-07 NOTE — Consult Note (Addendum)
ORTHOPAEDIC CONSULTATION  REQUESTING PHYSICIAN: Darlin Priestly, MD  Chief Complaint: Left knee pain  HPI: Nicole Logan is a 50 y.o. female who complains of left knee pain.  Patient has had her left knee MRI.  I have reviewed these results.  Patient is found to have a mid substance ACL tear.  This however does not appear to be acute as there is no associated osseous edema pattern as is normally seen with an acute ACL.  She has a tear to the lateral meniscus which was nondisplaced.  She has a MCL sprain as well as a large knee effusion and chondromalacia particularly in the medial compartment.  She also had intramuscular edema within the popliteus muscle and medial and lateral gastrocnemius muscles consistent with acute strain.      Past Medical History:  Diagnosis Date   CRPS (complex regional pain syndrome type I)    Hypertension    Past Surgical History:  Procedure Laterality Date   LITHOTRIPSY     TONSILLECTOMY     Social History   Socioeconomic History   Marital status: Divorced    Spouse name: Not on file   Number of children: Not on file   Years of education: Not on file   Highest education level: Not on file  Occupational History   Not on file  Tobacco Use   Smoking status: Former    Packs/day: 1.00    Types: Cigarettes   Smokeless tobacco: Never  Vaping Use   Vaping Use: Never used  Substance and Sexual Activity   Alcohol use: Not Currently   Drug use: Never   Sexual activity: Not on file  Other Topics Concern   Not on file  Social History Narrative   Not on file   Social Determinants of Health   Financial Resource Strain: Not on file  Food Insecurity: Not on file  Transportation Needs: Not on file  Physical Activity: Not on file  Stress: Not on file  Social Connections: Not on file   Family History  Problem Relation Age of Onset   Thyroid disease Mother    Kidney disease Mother    Thyroid disease Sister    Allergies  Allergen Reactions   Latex  Hives, Itching, Rash and Swelling   Atenolol Other (See Comments)   Diatrizoate Itching   Lamotrigine Other (See Comments)    Chest discomfort/ anxiety attack Chest discomfort/ anxiety attack    Lisinopril Other (See Comments)    Headaches, sick on stomach   Morphine And Related Other (See Comments)    "severe migraine"   Tramadol Other (See Comments)    "severe migraine"   Eggs Or Egg-Derived Products Diarrhea and Rash   Iodinated Diagnostic Agents Other (See Comments) and Rash    HIves, "I feel like I'm on fire"   Prior to Admission medications   Medication Sig Start Date End Date Taking? Authorizing Provider  albuterol (VENTOLIN HFA) 108 (90 Base) MCG/ACT inhaler Inhale 2 puffs into the lungs every 6 (six) hours as needed for wheezing or shortness of breath.   Yes [provider]  allopurinol (ZYLOPRIM) 300 MG tablet Take 300 mg by mouth daily.   Yes [provider]  ALPRAZolam Prudy Feeler) 1 MG tablet Take 1 mg by mouth 3 (three) times daily as needed for anxiety.   Yes [provider]  amphetamine-dextroamphetamine (ADDERALL XR) 30 MG 24 hr capsule Take 30 mg by mouth 2 (two) times daily.   Yes [provider]  bictegravir-emtricitabine-tenofovir AF (BIKTARVY) 50-200-25 MG TABS tablet Take 1 tablet by mouth at bedtime.   Yes [provider]  clotrimazole-betamethasone (LOTRISONE) cream Apply 1 application topically 2 (two) times daily as needed (skin irritation).   Yes [provider]  cyclobenzaprine (FLEXERIL) 5 MG tablet Take 5 mg by mouth 2 (two) times daily.   Yes [provider]  eletriptan (RELPAX) 20 MG tablet Take 20 mg by mouth as needed for migraine or headache. May repeat in 2 hours if headache persists or recurs.   Yes [provider]  furosemide (LASIX) 20 MG tablet Take 20 mg by mouth daily.   Yes [provider]  Lurasidone HCl 120 MG TABS Take 120 mg by mouth at bedtime.   Yes [provider]  Methylnaltrexone Bromide 150 MG TABS Take 450 mg by mouth daily.   Yes [provider]  morphine (MS CONTIN) 15 MG 12 hr tablet Take 15 mg by mouth 3 (three) times daily.   Yes [provider]  Multiple Vitamins-Minerals (CENTRUM SILVER 50+WOMEN) TABS Take 1 tablet by mouth daily before breakfast.   Yes [provider]  olmesartan (BENICAR) 20 MG tablet Take 20 mg by mouth daily.   Yes [provider]  olopatadine (PATANOL) 0.1 % ophthalmic solution Place 1 drop into both eyes 2 (two) times daily as needed for allergies.   Yes [provider]  omeprazole (PRILOSEC) 20 MG capsule Take 20 mg by mouth 2 (two) times daily.   Yes [provider]  oxyCODONE-acetaminophen (PERCOCET) 7.5-325 MG tablet Take 1 tablet by mouth every 6 (six) hours as needed for moderate pain or severe pain. 08/12/20  Yes [provider]  pregabalin (LYRICA) 50 MG capsule Take 50 mg by mouth 2 (two) times daily.   Yes [provider]  zinc sulfate 220 (50 Zn) MG capsule Take 220 mg by mouth daily.   Yes [provider]  ziprasidone (GEODON) 40 MG capsule Take 40 mg by mouth 2 (two) times daily with a meal.   Yes [provider]   MR KNEE LEFT WO CONTRAST  Result Date: 09/07/2020 CLINICAL DATA:  Knee pain, chronic, degenerative disease on xray (Age >= 5y) EXAM: MRI OF THE LEFT KNEE WITHOUT CONTRAST TECHNIQUE: Multiplanar, multisequence MR imaging of the knee was performed. No intravenous contrast was administered. COMPARISON:  Knee radiograph 09/05/2018 FINDINGS: MENISCI Medial: Intact. Lateral: There is a horizontal tear of the posterior horn and body of the lateral meniscus, nondisplaced. LIGAMENTS Cruciates: There is a midsubstance tear of the ACL. The PCL is intact. Collaterals: Periligamentous edema along the medial collateral ligament, with internal signal proximally. The popliteus tendon and lateral collateral ligament  appear intact. The biceps femoris appears intact. CARTILAGE Patellofemoral:  Mild chondrosis. Medial:  Moderate chondrosis. Lateral:  Mild chondrosis. JOINT: Large joint effusion. POPLITEAL FOSSA: No significant Baker's cyst. EXTENSOR MECHANISM: Intact quadriceps tendon. Intact patellar tendon. BONES: There is minimal bone edema in the posteromedial and lateral tibial plateaus. Other: There is significant intramuscular edema within the popliteus muscle, with intact tendon. There also feathery edema within the medial and lateral gastrocnemius muscles proximally, to a lesser degree. IMPRESSION: Midsubstance tear of the ACL. Nondisplaced horizontal tear of the posterior horn and body of the lateral meniscus. Grade 2 medial collateral ligament sprain. Grade 1/2 muscle strain of the popliteus and grade 1 muscle strain of the proximal medial and lateral gastrocnemius muscles. Large joint effusion. Electronically Signed   By: Caprice Renshaw  M.D.   On: 09/07/2020 08:12    Positive ROS: All other systems have been reviewed and were otherwise negative with the exception of those mentioned in the HPI and as above.  Physical Exam: General: Alert, no acute distress   MUSCULOSKELETAL: Left knee: Patient has intact skin without erythema ecchymosis or effusion.  Patient has no obvious instability on exam but she was guarding.  Patient has significant pain with any passive range of motion to the knee.  She is distally neurovascular intact.  Her leg compartments are soft and compressible.  Assessment: Left chronic ACL tear Left knee MCL sprain Left nondisplaced lateral meniscus tear. Acute popliteus and gastrocnemius muscle strains  Plan:  Patient's mother was on the telephone when I arrived at the patient's room.  I discussed the MRI findings with the patient and her mother by phone.  I am recommending brace treatment for the left knee with physical therapy.  Her ACL tear appears chronic.  I would not recommend ACL  reconstructive surgery but rather physical therapy/rehab.  Patient and her mother understood and agreed this plan.  They also understand that patient's morbid obesity is playing a role in the condition of her left knee and may impact her ability to rehab this knee.  I fit the patient today for a telescoping left knee brace.  I have it locked in extension for now until her pain improves.  Patient may remove the brace to work on bedside knee range of motion exercises but should wear the brace if she is standing or ambulating.  I am recommending the patient restart physical therapy.  Patient may require stay at a skilled nursing facility upon discharge based on her ability to participate with physical therapy.  Patient lives on the second floor of her apartment complex and is very limited in her use of the left knee at this time.  Patient may follow-up in our office in approximately 2 to 3 weeks after discharge.   Juanell Fairly, MD    09/07/2020 2:14 PM

## 2020-09-07 NOTE — Progress Notes (Signed)
OT Cancellation Note  Patient Details Name: RAKISHA PINCOCK MRN: [id number] DOB: 1970-06-26   Cancelled Treatment:    Reason Eval/Treat Not Completed: Medical issues which prohibited therapy. Chart reviewed - Per ortho recs will hold services until results of MRI are available and further recs made.  Will follow at a distance for now.  Kathie Dike, M.S. OTR/L  09/07/20, 8:07 AM  ascom [phone]

## 2020-09-07 NOTE — Plan of Care (Signed)
Patient sleeping between care. Oriented x4. No request for pain medication overnight. No new changes in assessment. Call bell within reach.  PLAN OF CARE ONGOING Problem: Education: Goal: Knowledge of General Education information will improve Description: Including pain rating scale, medication(s)/side effects and non-pharmacologic comfort measures Outcome: Progressing   Problem: Health Behavior/Discharge Planning: Goal: Ability to manage health-related needs will improve Outcome: Progressing   Problem: Clinical Measurements: Goal: Ability to maintain clinical measurements within normal limits will improve Outcome: Progressing Goal: Will remain free from infection Outcome: Progressing Goal: Diagnostic test results will improve Outcome: Progressing Goal: Respiratory complications will improve Outcome: Progressing Goal: Cardiovascular complication will be avoided Outcome: Progressing   Problem: Activity: Goal: Risk for activity intolerance will decrease Outcome: Progressing   Problem: Nutrition: Goal: Adequate nutrition will be maintained Outcome: Progressing   Problem: Coping: Goal: Level of anxiety will decrease Outcome: Progressing   Problem: Elimination: Goal: Will not experience complications related to bowel motility Outcome: Progressing Goal: Will not experience complications related to urinary retention Outcome: Progressing   Problem: Pain Managment: Goal: General experience of comfort will improve Outcome: Progressing   Problem: Safety: Goal: Ability to remain free from injury will improve Outcome: Progressing   Problem: Skin Integrity: Goal: Risk for impaired skin integrity will decrease Outcome: Progressing

## 2020-09-07 NOTE — Progress Notes (Signed)
PROGRESS NOTE    Nicole Logan  SWF:[id number] DOB: 1970/09/26 DOA: 09/05/2020 PCP: Cory Roughen, PA-C  131A/131A-BB   Assessment & Plan:   Principal Problem:   Left hip pain Active Problems:   Back pain   Fall at home, initial encounter   Left knee pain   Hypertension   CRPS (complex regional pain syndrome type I)   AKI (acute kidney injury) (HCC)   HIV (human immunodeficiency virus infection) (HCC)   Depression with anxiety   Knee pain, left   Nicole Logan is a 50 y.o. female with medical history significant of morbid obesity hypertension, complex regional pain syndrome type I, HIV infection, hypertension, morbid obesity with BMI 60.08, who presents with fall, pain in left hip, left knee, lower back.   Patient states that she accidentally fell on the driveway when she was out of the car this morning.  No loss of consciousness.     Left chronic ACL tear, chronic Left knee MCL sprain Left nondisplaced lateral meniscus tear. Acute popliteus and gastrocnemius muscle strains --as shown on MRI left knee.  Ortho consulted. Plan: --Per ortho, may remove the brace to work on bedside knee range of motion exercises but should wear the brace if she is standing or ambulating. --follow up in ortho clinic 2 to 3 weeks after discharge  Left hip pain, back pain  Sciatica CT of lumbar spine and left hip was without any acute abnormality, did show worsening of degenerative disease.  She also has lumbar spinal stenosis and degenerative disc disease.   Plan: --cont home opioids, all as PRN, do not increase the dose --cont Flexeril --increase Lyrica   Fall at home, initial encounter -PT/OT   Hypertension:  BP has been wnl, even with home BP meds held --cont to hold home lasix and Benicar since BP normal without them.   CRPS (complex regional pain syndrome type I) on chronic opioids --cont home opioids all as PRN --do not increase --ensure scheduled bowel regimen    AKI, ruled out Creatinine 1.18 on presentation.  Baseline ~1.  HIV (human immunodeficiency virus infection) (HCC):  Viral load <20 on 03/26/2016 --cont home Biktarvy   Depression with anxiety --cont home Xanax PRN    DVT prophylaxis: Lovenox SQ Code Status: Full code  Family Communication: friend updated at bedside today Level of care: Med-Surg Dispo:   The patient is from: home Anticipated d/c is to: undetermined Anticipated d/c date is: undetermined Patient currently is not medically ready to d/c due to: pending safe disposition   Subjective and Interval History:  Pt complained of a new back pain that radiated across her back and down her left leg.  Asking for more pain medications.    PT/OT rec SNF, TOC started the search today, however, later in the day, pt said she may prefer to just go home with [facility] services.   Objective: Vitals:   09/06/20 2048 09/07/20 0315 09/07/20 0725 09/07/20 1532  BP: 102/71 109/68 119/79 (!) 123/91  Pulse: 98 95 (!) 102 (!) 104  Resp: 20 18 15 15   Temp: 98.6 F (37 C) 98.3 F (36.8 C) 98.2 F (36.8 C) 98.3 F (36.8 C)  TempSrc: Oral Oral    SpO2: 95% 90% 93% 98%  Weight:      Height:        Intake/Output Summary (Last 24 hours) at 09/07/2020 1912 Last data filed at 09/07/2020 1422 Gross per 24 hour  Intake 480 ml  Output 1150 ml  Net -670 ml   Filed Weights   09/04/20 1850  Weight: (!) 158.8 kg    Examination:   Constitutional: NAD, AAOx3 HEENT: conjunctivae and lids normal, EOMI CV: No cyanosis.   RESP: normal respiratory effort, on RA Extremities: No effusions, edema in BLE SKIN: warm, dry Neuro: II - XII grossly intact.   Psych: Normal mood and affect.     Data Reviewed: I have personally reviewed following labs and imaging studies  CBC: Recent Labs  Lab 09/05/20 0248 09/05/20 1745  WBC 9.0 7.7  NEUTROABS 5.3 4.4  HGB 11.2* 11.2  HCT 35.8* 36.4  MCV 85.6 85  PLT 263 276   Basic Metabolic Panel: Recent  Labs  Lab 09/05/20 0248 09/06/20 0403  NA 139 138  K 3.8 3.8  CL 105 104  CO2 27 25  GLUCOSE 146* 131*  BUN 17 18  CREATININE 1.18* 0.85  CALCIUM 8.9 9.1   GFR: Estimated Creatinine Clearance: 119 mL/min (by C-G formula based on SCr of 0.85 mg/dL). Liver Function Tests: No results for input(s): AST, ALT, ALKPHOS, BILITOT, PROT, ALBUMIN in the last 168 hours. No results for input(s): LIPASE, AMYLASE in the last 168 hours. No results for input(s): AMMONIA in the last 168 hours. Coagulation Profile: No results for input(s): INR, PROTIME in the last 168 hours. Cardiac Enzymes: No results for input(s): CKTOTAL, CKMB, CKMBINDEX, TROPONINI in the last 168 hours. BNP (last 3 results) No results for input(s): PROBNP in the last 8760 hours. HbA1C: No results for input(s): HGBA1C in the last 72 hours. CBG: No results for input(s): GLUCAP in the last 168 hours. Lipid Profile: No results for input(s): CHOL, HDL, LDLCALC, TRIG, CHOLHDL, LDLDIRECT in the last 72 hours. Thyroid Function Tests: No results for input(s): TSH, T4TOTAL, FREET4, T3FREE, THYROIDAB in the last 72 hours. Anemia Panel: No results for input(s): VITAMINB12, FOLATE, FERRITIN, TIBC, IRON, RETICCTPCT in the last 72 hours. Sepsis Labs: No results for input(s): PROCALCITON, LATICACIDVEN in the last 168 hours.  Recent Results (from the past 240 hour(s))  Resp Panel by RT-PCR (Flu A&B, Covid) Nasopharyngeal Swab     Status: None   Collection Time: 09/05/20  8:13 PM   Specimen: Nasopharyngeal Swab; Nasopharyngeal(NP) swabs in vial transport medium  Result Value Ref Range Status   SARS Coronavirus 2 by RT PCR NEGATIVE NEGATIVE Final    Comment: (NOTE) SARS-CoV-2 target nucleic acids are NOT DETECTED.  The SARS-CoV-2 RNA is generally detectable in upper respiratory specimens during the acute phase of infection. The lowest concentration of SARS-CoV-2 viral copies this assay can detect is 138 copies/mL. A negative result  does not preclude SARS-Cov-2 infection and should not be used as the sole basis for treatment or other patient management decisions. A negative result may occur with  improper specimen collection/handling, submission of specimen other than nasopharyngeal swab, presence of viral mutation(s) within the areas targeted by this assay, and inadequate number of viral copies(<138 copies/mL). A negative result must be combined with clinical observations, patient history, and epidemiological information. The expected result is Negative.  Fact Sheet for Patients:  BloggerCourse.com  Fact Sheet for Healthcare Providers:  SeriousBroker.it  This test is no t yet approved or cleared by the [place] FDA and  has been authorized for detection and/or diagnosis of SARS-CoV-2 by FDA under an Emergency Use Authorization (EUA). This EUA will remain  in effect (meaning this test can be used) for the duration of the COVID-19 declaration under Section 564(b)(1) of the Act,  21 U.S.C.section 360bbb-3(b)(1), unless the authorization is terminated  or revoked sooner.       Influenza A by PCR NEGATIVE NEGATIVE Final   Influenza B by PCR NEGATIVE NEGATIVE Final    Comment: (NOTE) The Xpert Xpress SARS-CoV-2/FLU/RSV plus assay is intended as an aid in the diagnosis of influenza from Nasopharyngeal swab specimens and should not be used as a sole basis for treatment. Nasal washings and aspirates are unacceptable for Xpert Xpress SARS-CoV-2/FLU/RSV testing.  Fact Sheet for Patients: BloggerCourse.com  Fact Sheet for Healthcare Providers: SeriousBroker.it  This test is not yet approved or cleared by the [place] FDA and has been authorized for detection and/or diagnosis of SARS-CoV-2 by FDA under an Emergency Use Authorization (EUA). This EUA will remain in effect (meaning this test can be used) for  the duration of the COVID-19 declaration under Section 564(b)(1) of the Act, 21 U.S.C. section 360bbb-3(b)(1), unless the authorization is terminated or revoked.  Performed at [facility], [address] [place], [place] [address]       Radiology Studies: MR KNEE LEFT WO CONTRAST  Result Date: 09/07/2020 CLINICAL DATA:  Knee pain, chronic, degenerative disease on xray (Age >= 5y) EXAM: MRI OF THE LEFT KNEE WITHOUT CONTRAST TECHNIQUE: Multiplanar, multisequence MR imaging of the knee was performed. No intravenous contrast was administered. COMPARISON:  Knee radiograph 09/05/2018 FINDINGS: MENISCI Medial: Intact. Lateral: There is a horizontal tear of the posterior horn and body of the lateral meniscus, nondisplaced. LIGAMENTS Cruciates: There is a midsubstance tear of the ACL. The PCL is intact. Collaterals: Periligamentous edema along the medial collateral ligament, with internal signal proximally. The popliteus tendon and lateral collateral ligament appear intact. The biceps femoris appears intact. CARTILAGE Patellofemoral:  Mild chondrosis. Medial:  Moderate chondrosis. Lateral:  Mild chondrosis. JOINT: Large joint effusion. POPLITEAL FOSSA: No significant Baker's cyst. EXTENSOR MECHANISM: Intact quadriceps tendon. Intact patellar tendon. BONES: There is minimal bone edema in the posteromedial and lateral tibial plateaus. Other: There is significant intramuscular edema within the popliteus muscle, with intact tendon. There also feathery edema within the medial and lateral gastrocnemius muscles proximally, to a lesser degree. IMPRESSION: Midsubstance tear of the ACL. Nondisplaced horizontal tear of the posterior horn and body of the lateral meniscus. Grade 2 medial collateral ligament sprain. Grade 1/2 muscle strain of the popliteus and grade 1 muscle strain of the proximal medial and lateral gastrocnemius muscles. Large joint effusion. Electronically Signed   By: Caprice Renshaw M.D.   On:  09/07/2020 08:12     Scheduled Meds:  allopurinol  300 mg Oral Daily   amphetamine-dextroamphetamine  30 mg Oral BID   bictegravir-emtricitabine-tenofovir AF  1 tablet Oral Daily   cyclobenzaprine  5 mg Oral BID   enoxaparin (LOVENOX) injection  0.5 mg/kg Subcutaneous Q24H   lurasidone  120 mg Oral QHS   multivitamin with minerals  1 tablet Oral Q1200   naloxegol oxalate  25 mg Oral Daily   pantoprazole  40 mg Oral BID   polyethylene glycol  17 g Oral BID   pregabalin  75 mg Oral TID   ziprasidone  40 mg Oral BID WC   Continuous Infusions:   LOS: 0 days     Darlin Priestly, MD [facility] If 7PM-7AM, please contact night-coverage 09/07/2020, 7:12 PM

## 2020-09-07 NOTE — [place] FL2 (Signed)
[place] MEDICAID FL2 LEVEL OF CARE SCREENING TOOL     IDENTIFICATION  Patient Name: Nicole Logan Birthdate: March 07, 1970 Sex: female Admission Date (Current Location): 09/05/2020  [facility] and [place] Number:  Chiropodist and Address:  [facility], [address], [place], [place] [address]      Provider Number: [id number]  Attending Physician Name and Address:  Darlin Priestly, MD  Relative Name and Phone Number:  [place] [phone]    Current Level of Care: Hospital Recommended Level of Care: Skilled Nursing Facility Prior Approval Number:    Date Approved/Denied:   PASRR Number: [id number]  Discharge Plan: SNF    Current Diagnoses: Patient Active Problem List   Diagnosis Date Noted   Knee pain, left 09/07/2020   Left hip pain 09/05/2020   Back pain 09/05/2020   Fall at home, initial encounter 09/05/2020   Left knee pain 09/05/2020   HIV (human immunodeficiency virus infection) (HCC) 09/05/2020   Depression with anxiety 09/05/2020   Hypertension    CRPS (complex regional pain syndrome type I)    AKI (acute kidney injury) (HCC)     Orientation RESPIRATION BLADDER Height & Weight     Self, Time, Situation, Place  Normal Incontinent, External catheter Weight: (!) 350 lb (158.8 kg) Height:  5\' 4"  (162.6 cm)  BEHAVIORAL SYMPTOMS/MOOD NEUROLOGICAL BOWEL NUTRITION STATUS      Incontinent Diet (see discharge summary)  AMBULATORY STATUS COMMUNICATION OF NEEDS Skin   Extensive Assist Verbally Normal                       Personal Care Assistance Level of Assistance  Bathing, Feeding, Dressing, Total care Bathing Assistance: Maximum assistance Feeding assistance: Independent Dressing Assistance: Maximum assistance Total Care Assistance: Maximum assistance   Functional Limitations Info  Sight, Hearing, Speech Sight Info: Adequate Hearing Info: Adequate Speech Info: Adequate    SPECIAL CARE FACTORS FREQUENCY  PT (By  licensed PT), OT (By licensed OT)     PT Frequency: min 4x weekly OT Frequency: min 4x weekly            Contractures Contractures Info: Not present    Additional Factors Info  Code Status, Allergies Code Status Info: full Allergies Info: latex, atenolol, diatrizoate, lamotrigine, lisinopril, morphine and related, tramadol, eggs or eg derived products, iodinated diagnostic agents           Current Medications (09/07/2020):  This is the current hospital active medication list Current Facility-Administered Medications  Medication Dose Route Frequency Provider Last Rate Last Admin   acetaminophen (TYLENOL) tablet 650 mg  650 mg Oral Q6H PRN 09/09/2020, MD       allopurinol (ZYLOPRIM) tablet 300 mg  300 mg Oral Daily Lorretta Harp, MD   300 mg at 09/07/20 0840   ALPRAZolam 09/09/20) tablet 1 mg  1 mg Oral TID PRN Prudy Feeler, MD   1 mg at 09/06/20 1847   amphetamine-dextroamphetamine (ADDERALL XR) 24 hr capsule 30 mg  30 mg Oral BID 09/08/20, MD   30 mg at 09/07/20 1022   bictegravir-emtricitabine-tenofovir AF (BIKTARVY) 50-200-25 MG per tablet 1 tablet  1 tablet Oral Daily 09/09/20, MD   1 tablet at 09/06/20 2134   clotrimazole-betamethasone (LOTRISONE) cream 1 application  1 application Topical BID PRN 2135, MD       cyclobenzaprine (FLEXERIL) tablet 5 mg  5 mg Oral BID Lorretta Harp, MD   5 mg at 09/07/20 [id number]  eletriptan (RELPAX) tablet 20 mg  20 mg Oral PRN Lorretta Harp, MD       enoxaparin (LOVENOX) injection 80 mg  0.5 mg/kg Subcutaneous Q24H Lorretta Harp, MD   80 mg at 09/06/20 2125   hydrALAZINE (APRESOLINE) injection 5 mg  5 mg Intravenous Q2H PRN Lorretta Harp, MD       lurasidone (LATUDA) tablet 120 mg  120 mg Oral QHS Lorretta Harp, MD   120 mg at 09/06/20 2124   morphine (MS CONTIN) 12 hr tablet 15 mg  15 mg Oral Q12H Darlin Priestly, MD       multivitamin with minerals tablet 1 tablet  1 tablet Oral Q1200 Lorretta Harp, MD   1 tablet at 09/06/20 2123   naloxegol oxalate (MOVANTIK)  tablet 25 mg  25 mg Oral Daily Darlin Priestly, MD       ondansetron [facility]) injection 4 mg  4 mg Intravenous Q8H PRN Lorretta Harp, MD       oxyCODONE-acetaminophen (PERCOCET) 7.5-325 MG per tablet 1 tablet  1 tablet Oral Q6H PRN Darlin Priestly, MD   1 tablet at 09/07/20 1552   pantoprazole (PROTONIX) EC tablet 40 mg  40 mg Oral BID Lorretta Harp, MD   40 mg at 09/07/20 1022   polyethylene glycol (MIRALAX / GLYCOLAX) packet 17 g  17 g Oral BID Darlin Priestly, MD       pregabalin (LYRICA) capsule 50 mg  50 mg Oral BID Lorretta Harp, MD   50 mg at 09/07/20 0840   senna-docusate (Senokot-S) tablet 1 tablet  1 tablet Oral QHS PRN Lorretta Harp, MD       ziprasidone (GEODON) capsule 40 mg  40 mg Oral BID WC Lorretta Harp, MD   40 mg at 09/07/20 0840     Discharge Medications: Please see discharge summary for a list of discharge medications.  Relevant Imaging Results:  Relevant Lab Results:   Additional Information SSN:[id number]  Gildardo Griffes, LCSW

## 2020-09-07 NOTE — Progress Notes (Signed)
Physical Therapy Treatment Patient Details Name: Nicole Logan MRN: [id number] DOB: 10-19-1970 Today's Date: 09/07/2020    History of Present Illness 50 y.o. female with PMH of HIV, hypertension, type 2 diabetes, NASH, peripheral neuropathy, IBS, migraines, hypothyroidism, hypercholesterolemia, hepatitis, and ADHD who presents with left knee/hip and leg pain, acute onset when the patient was standing and felt/heard a pop in her left knee.She could not bear weight and collapsed to the ground. 09/04/20 L knee imaging: No evidence of fracture, dislocation, or joint effusion. MRI 8/31 reveals acute and chronic ligimentus, meniscal and muscular strains/tears.    PT Comments    Pt cleared for PT by ortho, now with hinged KI (locked in ext).  She is still c/o a lot of pain and tolerates very little AROM but did well with some introduced HEP, mostly showing ability to do QS and SLRs w/o assist, needing AAROM with most other knee/hip involved exercises.  Deferred mobility/sitting 2/2 pt anxiety, fear, pain.  Encouraged regular self HEP activity and reinforced that she has a lot of work ahead of her but that she needs to try and do a little more each day.     Follow Up Recommendations  SNF;Supervision/Assistance - 24 hour     Equipment Recommendations  Rolling walker with 5" wheels;3in1 (PT) (bariatic)    Recommendations for Other Services       Precautions / Restrictions Precautions Precautions: Fall Required Braces or Orthoses: Knee Immobilizer - Left Restrictions Weight Bearing Restrictions: Yes Other Position/Activity Restrictions: knee immobilizer locked in extension during WBing/ambulation    Mobility  Bed Mobility               General bed mobility comments: Pt too fearful of trying to get up, agrees to supine exercises. Encouraged/educated that she has a lot of work ahead of her and that she will need to start mobilizing soon.    Transfers                     Ambulation/Gait                 Stairs             Wheelchair Mobility    Modified Rankin (Stroke Patients Only)       Balance                                            Cognition Arousal/Alertness: Awake/alert Behavior During Therapy: Anxious Overall Cognitive Status: Within Functional Limits for tasks assessed                                        Exercises General Exercises - Lower Extremity Ankle Circles/Pumps: AROM;10 reps Quad Sets: Strengthening;20 reps;Left Heel Slides: AAROM;5 reps;Left (unlocked brace for gentle ROM, very limited tolerance but good effort.) Hip ABduction/ADduction: AAROM;5 reps;Left Straight Leg Raises: AROM;10 reps    General Comments        Pertinent Vitals/Pain Pain Assessment: 0-10 Pain Score: 9  Pain Location: continues to be hyper-reactive to palpation posterior and medial knee, pain at rest in 4/10 range    Home Living                      Prior Function  PT Goals (current goals can now be found in the care plan section) Progress towards PT goals: Progressing toward goals    Frequency    Min 2X/week      PT Plan Current plan remains appropriate    Co-evaluation              AM-PAC PT "6 Clicks" Mobility   Outcome Measure  Help needed turning from your back to your side while in a flat bed without using bedrails?: A Lot Help needed moving from lying on your back to sitting on the side of a flat bed without using bedrails?: A Lot Help needed moving to and from a bed to a chair (including a wheelchair)?: Total Help needed standing up from a chair using your arms (e.g., wheelchair or bedside chair)?: Total Help needed to walk in hospital room?: Total Help needed climbing 3-5 steps with a railing? : Total 6 Click Score: 8    End of Session   Activity Tolerance: Patient limited by pain Patient left: with bed alarm set;with call bell/phone  within reach;with family/visitor present Nurse Communication: Mobility status PT Visit Diagnosis: Difficulty in walking, not elsewhere classified (R26.2);Pain;Muscle weakness (generalized) (M62.81);Unsteadiness on feet (R26.81) Pain - Right/Left: Left Pain - part of body: Knee     Time: 1710-1742 PT Time Calculation (min) (ACUTE ONLY): 32 min  Charges:  $Therapeutic Exercise: 23-37 mins                     Malachi Pro, DPT 09/07/2020, 6:06 PM

## 2020-09-08 DIAGNOSIS — M25552 Pain in left hip: Secondary | ICD-10-CM | POA: Diagnosis not present

## 2020-09-08 NOTE — Progress Notes (Signed)
PROGRESS NOTE    Nicole Logan  GNF:[id number] DOB: June 23, 1970 DOA: 09/05/2020 PCP: Cory Roughen, PA-C  131A/131A-BB   Assessment & Plan:   Principal Problem:   Left hip pain Active Problems:   Back pain   Fall at home, initial encounter   Left knee pain   Hypertension   CRPS (complex regional pain syndrome type I)   AKI (acute kidney injury) (HCC)   HIV (human immunodeficiency virus infection) (HCC)   Depression with anxiety   Knee pain, left   Nicole Logan is a 50 y.o. female with medical history significant of morbid obesity hypertension, complex regional pain syndrome type I, HIV infection, hypertension, morbid obesity with BMI 60.08, who presents with fall, pain in left hip, left knee, lower back.   Patient states that she accidentally fell on the driveway when she was out of the car this morning.  No loss of consciousness.     Left chronic ACL tear, chronic Left knee MCL sprain Left nondisplaced lateral meniscus tear. Acute popliteus and gastrocnemius muscle strains --as shown on MRI left knee.  Ortho consulted. Plan: --Per ortho, may remove the brace to work on bedside knee range of motion exercises but should wear the brace if she is standing or ambulating. --follow up in ortho clinic 2 to 3 weeks after discharge  Left hip pain, back pain  Sciatica CT of lumbar spine and left hip was without any acute abnormality, did show worsening of degenerative disease.  She also has lumbar spinal stenosis and degenerative disc disease.   Plan: --cont home opioids, all as PRN, do not increase the dose --cont Flexeril --cont Lyrica at increased dose of 75 mg TID   Fall at home, initial encounter -PT/OT   Hypertension:  BP has been wnl, even with home BP meds held --cont to hold home lasix and Benicar since BP normal without them.   CRPS (complex regional pain syndrome type I) on chronic opioids --cont home opioids all as PRN --do not increase --ensure  scheduled bowel regimen   AKI, ruled out Creatinine 1.18 on presentation.  Baseline ~1.  HIV (human immunodeficiency virus infection) (HCC):  Viral load <20 on 03/26/2016 --cont home Biktarvy   Depression with anxiety --cont home Xanax PRN    DVT prophylaxis: Lovenox SQ Code Status: Full code  Family Communication:  Level of care: Med-Surg Dispo:   The patient is from: home Anticipated d/c is to: SNF Anticipated d/c date is: whenever bed available Patient currently is ready for discharge.   Subjective and Interval History:  Pt changed her mind again and now wants to be discharged to SNF.     Objective: Vitals:   09/07/20 1950 09/08/20 0206 09/08/20 0737 09/08/20 1500  BP: 121/61 (!) 109/58 (!) 94/59 102/70  Pulse: (!) 102 93 94   Resp: 18 18 15 15   Temp: 98 F (36.7 C) 98.3 F (36.8 C) 98.1 F (36.7 C) 98.2 F (36.8 C)  TempSrc:    Oral  SpO2: 95% 95% 95% 97%  Weight:      Height:        Intake/Output Summary (Last 24 hours) at 09/08/2020 1612 Last data filed at 09/08/2020 1351 Gross per 24 hour  Intake 360 ml  Output 350 ml  Net 10 ml   Filed Weights   09/04/20 1850  Weight: (!) 158.8 kg    Examination:   Constitutional: NAD, AAOx3 HEENT: conjunctivae and lids normal, EOMI CV: No cyanosis.   RESP:  normal respiratory effort, on RA Extremities: LLE in brace SKIN: warm, dry Neuro: II - XII grossly intact.   Psych: Normal mood and affect.     Data Reviewed: I have personally reviewed following labs and imaging studies  CBC: Recent Labs  Lab 09/05/20 0248 09/05/20 1745  WBC 9.0 7.7  NEUTROABS 5.3 4.4  HGB 11.2* 11.2  HCT 35.8* 36.4  MCV 85.6 85  PLT 263 276   Basic Metabolic Panel: Recent Labs  Lab 09/05/20 0248 09/06/20 0403  NA 139 138  K 3.8 3.8  CL 105 104  CO2 27 25  GLUCOSE 146* 131*  BUN 17 18  CREATININE 1.18* 0.85  CALCIUM 8.9 9.1   GFR: Estimated Creatinine Clearance: 119 mL/min (by C-G formula based on SCr of 0.85  mg/dL). Liver Function Tests: No results for input(s): AST, ALT, ALKPHOS, BILITOT, PROT, ALBUMIN in the last 168 hours. No results for input(s): LIPASE, AMYLASE in the last 168 hours. No results for input(s): AMMONIA in the last 168 hours. Coagulation Profile: No results for input(s): INR, PROTIME in the last 168 hours. Cardiac Enzymes: No results for input(s): CKTOTAL, CKMB, CKMBINDEX, TROPONINI in the last 168 hours. BNP (last 3 results) No results for input(s): PROBNP in the last 8760 hours. HbA1C: No results for input(s): HGBA1C in the last 72 hours. CBG: No results for input(s): GLUCAP in the last 168 hours. Lipid Profile: No results for input(s): CHOL, HDL, LDLCALC, TRIG, CHOLHDL, LDLDIRECT in the last 72 hours. Thyroid Function Tests: No results for input(s): TSH, T4TOTAL, FREET4, T3FREE, THYROIDAB in the last 72 hours. Anemia Panel: No results for input(s): VITAMINB12, FOLATE, FERRITIN, TIBC, IRON, RETICCTPCT in the last 72 hours. Sepsis Labs: No results for input(s): PROCALCITON, LATICACIDVEN in the last 168 hours.  Recent Results (from the past 240 hour(s))  Resp Panel by RT-PCR (Flu A&B, Covid) Nasopharyngeal Swab     Status: None   Collection Time: 09/05/20  8:13 PM   Specimen: Nasopharyngeal Swab; Nasopharyngeal(NP) swabs in vial transport medium  Result Value Ref Range Status   SARS Coronavirus 2 by RT PCR NEGATIVE NEGATIVE Final    Comment: (NOTE) SARS-CoV-2 target nucleic acids are NOT DETECTED.  The SARS-CoV-2 RNA is generally detectable in upper respiratory specimens during the acute phase of infection. The lowest concentration of SARS-CoV-2 viral copies this assay can detect is 138 copies/mL. A negative result does not preclude SARS-Cov-2 infection and should not be used as the sole basis for treatment or other patient management decisions. A negative result may occur with  improper specimen collection/handling, submission of specimen other than  nasopharyngeal swab, presence of viral mutation(s) within the areas targeted by this assay, and inadequate number of viral copies(<138 copies/mL). A negative result must be combined with clinical observations, patient history, and epidemiological information. The expected result is Negative.  Fact Sheet for Patients:  BloggerCourse.com  Fact Sheet for Healthcare Providers:  SeriousBroker.it  This test is no t yet approved or cleared by the [place] FDA and  has been authorized for detection and/or diagnosis of SARS-CoV-2 by FDA under an Emergency Use Authorization (EUA). This EUA will remain  in effect (meaning this test can be used) for the duration of the COVID-19 declaration under Section 564(b)(1) of the Act, 21 U.S.C.section 360bbb-3(b)(1), unless the authorization is terminated  or revoked sooner.       Influenza A by PCR NEGATIVE NEGATIVE Final   Influenza B by PCR NEGATIVE NEGATIVE Final    Comment: (NOTE)  The Xpert Xpress SARS-CoV-2/FLU/RSV plus assay is intended as an aid in the diagnosis of influenza from Nasopharyngeal swab specimens and should not be used as a sole basis for treatment. Nasal washings and aspirates are unacceptable for Xpert Xpress SARS-CoV-2/FLU/RSV testing.  Fact Sheet for Patients: BloggerCourse.com  Fact Sheet for Healthcare Providers: SeriousBroker.it  This test is not yet approved or cleared by the [place] FDA and has been authorized for detection and/or diagnosis of SARS-CoV-2 by FDA under an Emergency Use Authorization (EUA). This EUA will remain in effect (meaning this test can be used) for the duration of the COVID-19 declaration under Section 564(b)(1) of the Act, 21 U.S.C. section 360bbb-3(b)(1), unless the authorization is terminated or revoked.  Performed at [facility], [address] [place], [place]  [address]       Radiology Studies: MR KNEE LEFT WO CONTRAST  Result Date: 09/07/2020 CLINICAL DATA:  Knee pain, chronic, degenerative disease on xray (Age >= 5y) EXAM: MRI OF THE LEFT KNEE WITHOUT CONTRAST TECHNIQUE: Multiplanar, multisequence MR imaging of the knee was performed. No intravenous contrast was administered. COMPARISON:  Knee radiograph 09/05/2018 FINDINGS: MENISCI Medial: Intact. Lateral: There is a horizontal tear of the posterior horn and body of the lateral meniscus, nondisplaced. LIGAMENTS Cruciates: There is a midsubstance tear of the ACL. The PCL is intact. Collaterals: Periligamentous edema along the medial collateral ligament, with internal signal proximally. The popliteus tendon and lateral collateral ligament appear intact. The biceps femoris appears intact. CARTILAGE Patellofemoral:  Mild chondrosis. Medial:  Moderate chondrosis. Lateral:  Mild chondrosis. JOINT: Large joint effusion. POPLITEAL FOSSA: No significant Baker's cyst. EXTENSOR MECHANISM: Intact quadriceps tendon. Intact patellar tendon. BONES: There is minimal bone edema in the posteromedial and lateral tibial plateaus. Other: There is significant intramuscular edema within the popliteus muscle, with intact tendon. There also feathery edema within the medial and lateral gastrocnemius muscles proximally, to a lesser degree. IMPRESSION: Midsubstance tear of the ACL. Nondisplaced horizontal tear of the posterior horn and body of the lateral meniscus. Grade 2 medial collateral ligament sprain. Grade 1/2 muscle strain of the popliteus and grade 1 muscle strain of the proximal medial and lateral gastrocnemius muscles. Large joint effusion. Electronically Signed   By: Caprice Renshaw M.D.   On: 09/07/2020 08:12     Scheduled Meds:  allopurinol  300 mg Oral Daily   amphetamine-dextroamphetamine  30 mg Oral BID   bictegravir-emtricitabine-tenofovir AF  1 tablet Oral Daily   cyclobenzaprine  5 mg Oral BID   enoxaparin (LOVENOX)  injection  0.5 mg/kg Subcutaneous Q24H   lurasidone  120 mg Oral QHS   multivitamin with minerals  1 tablet Oral Q1200   naloxegol oxalate  25 mg Oral Daily   pantoprazole  40 mg Oral BID   polyethylene glycol  17 g Oral BID   pregabalin  75 mg Oral TID   ziprasidone  40 mg Oral BID WC   Continuous Infusions:   LOS: 1 day     Darlin Priestly, MD [facility] If 7PM-7AM, please contact night-coverage 09/08/2020, 4:12 PM

## 2020-09-08 NOTE — Progress Notes (Signed)
Occupational Therapy Treatment Patient Details Name: Nicole Logan MRN: [id number] DOB: May 04, 1970 Today's Date: 09/08/2020    History of present illness 50 y.o. female with PMH of HIV, hypertension, type 2 diabetes, NASH, peripheral neuropathy, IBS, migraines, hypothyroidism, hypercholesterolemia, hepatitis, and ADHD who presents with left knee/hip and leg pain, acute onset when the patient was standing and felt/heard a pop in her left knee.She could not bear weight and collapsed to the ground. 09/04/20 L knee imaging: No evidence of fracture, dislocation, or joint effusion. MRI 8/31 reveals acute and chronic ligimentus, meniscal and muscular strains/tears.   OT comments  Nicole Logan was seen for OT treatment on this date. Upon arrival to room pt reclined in bed stating desire to d/c home vs rehab, agreeable to ADL session to simulate home environment. SUPERVISION for bed mobility - heavy use of bed rails to exit bed and sheet as leg lifter to return to bed.  MIN A + RW for BSC t/f - assist for RW mgmt and cues for safety. CGA for perihygiene with lateral leans, unable to complete thoroughly ultimately requiring MAX A + RW in standing. MAX A for LBD at bed level. Extensive education given within scope of OT on d/c recommendations, adapted dressing strategies, and DME recs. Pt reports 9/10 pain - RN notified. Pt making good progress toward goals. Pt continues to benefit from skilled OT services to maximize return to PLOF and minimize risk of future falls, injury, caregiver burden, and readmission. Will continue to follow POC. Discharge recommendation remains appropriate.    Follow Up Recommendations  SNF    Equipment Recommendations  3 in 1 bedside commode;Wheelchair (measurements OT)    Recommendations for Other Services      Precautions / Restrictions Precautions Precautions: Fall Required Braces or Orthoses: Knee Immobilizer - Left Restrictions Weight Bearing Restrictions: Yes LLE Weight  Bearing: Weight bearing as tolerated Other Position/Activity Restrictions: knee immobilizer locked in extension during WBing/ambulation       Mobility Bed Mobility Overal bed mobility: Needs Assistance Bed Mobility: Supine to Sit;Sit to Supine     Supine to sit: Supervision;HOB elevated Sit to supine: Supervision   General bed mobility comments: heavy use of bed rails to exit bed and sheet as leg lifter to return to bed    Transfers Overall transfer level: Needs assistance Equipment used: Rolling walker (2 wheeled) Transfers: Sit to/from [employer] Transfers Sit to Stand: From elevated surface;Min guard Stand pivot transfers: Min assist;From elevated surface       General transfer comment: no physical assist to rise from [facility], cues for safety and assist for RW mgmt    Balance Overall balance assessment: Needs assistance;History of Falls Sitting-balance support: No upper extremity supported;Feet supported Sitting balance-Nicole Logan: Good     Standing balance support: Bilateral upper extremity supported Standing balance-Nicole Logan: Fair                             ADL either performed or assessed with clinical judgement   ADL Overall ADL's : Needs assistance/impaired                                       General ADL Comments: MIN A + RW for BSC t/f - assist for RW mgmt and cues for safety. CGA for perihygiene with lateral leans, unable to complete thoroughly ultimately requiring MAX  A + RW in standing. MAX A for LBD at bed level      Cognition Arousal/Alertness: Awake/alert Behavior During Therapy: Anxious Overall Cognitive Status: Within Functional Limits for tasks assessed                                          Exercises Exercises: Other exercises Other Exercises Other Exercises: Pt educated re: DME recs, d/c recs, falls prevention, ECS, pain mgmt, adapted dressing techniques, HEP Other Exercises: LBD,  toileting, sup<>sit, sit<>stand, SPT, sitting/standing balace/tolerance           Pertinent Vitals/ Pain       Pain Assessment: 0-10 Pain Score: 9  Pain Location: L knee Pain Descriptors / Indicators: Grimacing;Moaning;Shooting;Sharp Pain Intervention(s): Limited activity within patient's tolerance;Repositioned;Premedicated before session;Patient requesting pain meds-RN notified   Frequency  Min 2X/week        Progress Toward Goals  OT Goals(current goals can now be found in the care plan section)  Progress towards OT goals: Progressing toward goals  Acute Rehab OT Goals Patient Stated Goal: control L knee and hip pain OT Goal Formulation: With patient Time For Goal Achievement: 09/20/20 Potential to Achieve Goals: Good ADL Goals Pt Will Perform Grooming: with modified independence;sitting Pt Will Perform Lower Body Dressing: with modified independence;sitting/lateral leans Pt Will Transfer to Toilet: with modified independence;stand pivot transfer;bedside commode  Plan Discharge plan remains appropriate;Frequency remains appropriate    Co-evaluation                 AM-PAC OT "6 Clicks" Daily Activity     Outcome Measure   Help from another person eating meals?: None Help from another person taking care of personal grooming?: A Little Help from another person toileting, which includes using toliet, bedpan, or urinal?: A Lot Help from another person bathing (including washing, rinsing, drying)?: A Lot Help from another person to put on and taking off regular upper body clothing?: A Little Help from another person to put on and taking off regular lower body clothing?: A Lot 6 Click Score: 16    End of Session Equipment Utilized During Treatment: Rolling walker  OT Visit Diagnosis: Other abnormalities of gait and mobility (R26.89);Muscle weakness (generalized) (M62.81)   Activity Tolerance Patient tolerated treatment well   Patient Left in bed;with call  bell/phone within reach;with bed alarm set   Nurse Communication Mobility status;Patient requests pain meds        Time: 0913-1008 OT Time Calculation (min): 55 min  Charges: OT General Charges $OT Visit: 1 Visit OT Treatments $Self Care/Home Management : 53-67 mins  Kathie Dike, M.S. OTR/L  09/08/20, 10:47 AM  ascom [phone]

## 2020-09-08 NOTE — NC FL2 (Signed)
[place] MEDICAID FL2 LEVEL OF CARE SCREENING TOOL     IDENTIFICATION  Patient Name: Nicole Logan Birthdate: 1971-01-04 Sex: female Admission Date (Current Location): 09/05/2020  [facility] and [place] Number:  Chiropodist and Address:  [facility], [address] [place], [place] [address]      Provider Number: [id number]  Attending Physician Name and Address:  Darlin Priestly, MD  Relative Name and Phone Number:  [place] [phone]    Current Level of Care: Hospital Recommended Level of Care: Skilled Nursing Facility Prior Approval Number:    Date Approved/Denied:   PASRR Number: [id number]  Discharge Plan: SNF    Current Diagnoses: Patient Active Problem List   Diagnosis Date Noted   Knee pain, left 09/07/2020   Left hip pain 09/05/2020   Back pain 09/05/2020   Fall at home, initial encounter 09/05/2020   Left knee pain 09/05/2020   HIV (human immunodeficiency virus infection) (HCC) 09/05/2020   Depression with anxiety 09/05/2020   Hypertension    CRPS (complex regional pain syndrome type I)    AKI (acute kidney injury) (HCC)     Orientation RESPIRATION BLADDER Height & Weight     Self, Time, Situation, Place  Normal Incontinent, External catheter Weight: (!) 158.8 kg Height:  5\' 4"  (162.6 cm)  BEHAVIORAL SYMPTOMS/MOOD NEUROLOGICAL BOWEL NUTRITION STATUS      Incontinent Diet (see discharge summary)  AMBULATORY STATUS COMMUNICATION OF NEEDS Skin   Extensive Assist Verbally Normal                       Personal Care Assistance Level of Assistance  Bathing, Feeding, Dressing, Total care Bathing Assistance: Maximum assistance Feeding assistance: Independent Dressing Assistance: Maximum assistance Total Care Assistance: Maximum assistance   Functional Limitations Info  Sight, Hearing, Speech Sight Info: Adequate Hearing Info: Adequate Speech Info: Adequate    SPECIAL CARE FACTORS FREQUENCY  PT (By licensed  PT), OT (By licensed OT)     PT Frequency: min 4x weekly OT Frequency: min 4x weekly            Contractures Contractures Info: Not present    Additional Factors Info  Code Status, Allergies Code Status Info: full Allergies Info: latex, atenolol, diatrizoate, lamotrigine, lisinopril, morphine and related, tramadol, eggs or eg derived products, iodinated diagnostic agents           Current Medications (09/08/2020):  This is the current hospital active medication list Current Facility-Administered Medications  Medication Dose Route Frequency Provider Last Rate Last Admin   acetaminophen (TYLENOL) tablet 650 mg  650 mg Oral Q6H PRN 11/08/2020, MD       allopurinol (ZYLOPRIM) tablet 300 mg  300 mg Oral Daily Lorretta Harp, MD   300 mg at 09/08/20 0836   ALPRAZolam 11/08/20) tablet 1 mg  1 mg Oral TID PRN Prudy Feeler, MD   1 mg at 09/08/20 1309   amphetamine-dextroamphetamine (ADDERALL XR) 24 hr capsule 30 mg  30 mg Oral BID 11/08/20, MD   30 mg at 09/08/20 1041   bictegravir-emtricitabine-tenofovir AF (BIKTARVY) 50-200-25 MG per tablet 1 tablet  1 tablet Oral Daily 11/08/20, MD   1 tablet at 09/07/20 2207   clotrimazole-betamethasone (LOTRISONE) cream 1 application  1 application Topical BID PRN 2208, MD       cyclobenzaprine (FLEXERIL) tablet 5 mg  5 mg Oral BID Lorretta Harp, MD   5 mg at 09/08/20 0835   eletriptan (  RELPAX) tablet 20 mg  20 mg Oral PRN Lorretta Harp, MD       enoxaparin (LOVENOX) injection 80 mg  0.5 mg/kg Subcutaneous Q24H Lorretta Harp, MD   80 mg at 09/07/20 2207   hydrALAZINE (APRESOLINE) injection 5 mg  5 mg Intravenous Q2H PRN Lorretta Harp, MD       lurasidone (LATUDA) tablet 120 mg  120 mg Oral QHS Lorretta Harp, MD   120 mg at 09/07/20 2209   morphine (MS CONTIN) 12 hr tablet 15 mg  15 mg Oral Q8H PRN Darlin Priestly, MD   15 mg at 09/08/20 1041   multivitamin with minerals tablet 1 tablet  1 tablet Oral Q1200 Lorretta Harp, MD   1 tablet at 09/07/20 2207   naloxegol oxalate  (MOVANTIK) tablet 25 mg  25 mg Oral Daily Darlin Priestly, MD   25 mg at 09/08/20 0836   ondansetron (ZOFRAN) injection 4 mg  4 mg Intravenous Q8H PRN Lorretta Harp, MD       oxyCODONE-acetaminophen (PERCOCET) 7.5-325 MG per tablet 1 tablet  1 tablet Oral Q6H PRN Darlin Priestly, MD   1 tablet at 09/08/20 0835   pantoprazole (PROTONIX) EC tablet 40 mg  40 mg Oral BID Lorretta Harp, MD   40 mg at 09/08/20 0836   polyethylene glycol (MIRALAX / GLYCOLAX) packet 17 g  17 g Oral BID Darlin Priestly, MD   17 g at 09/08/20 0839   pregabalin (LYRICA) capsule 75 mg  75 mg Oral TID Darlin Priestly, MD   75 mg at 09/08/20 3662   senna-docusate (Senokot-S) tablet 1 tablet  1 tablet Oral QHS PRN Lorretta Harp, MD       ziprasidone (GEODON) capsule 40 mg  40 mg Oral BID WC Lorretta Harp, MD   40 mg at 09/08/20 9476     Discharge Medications: Please see discharge summary for a list of discharge medications.  Relevant Imaging Results:  Relevant Lab Results:   Additional Information SSN:[id number]  Caryn Section, RN

## 2020-09-08 NOTE — TOC Progression Note (Signed)
Transition of Care [facility]) - Progression Note    Patient Details  Name: Nicole Logan MRN: [id number] Date of Birth: 20-May-1970  Transition of Care [facility]) CM/SW Contact  Caryn Section, RN Phone Number: 09/08/2020, 1:33 PM  Clinical Narrative:   Patient was initially agreeable to SNF yesterday, but promptly changed her mind.  When PT and RNCM consulted with patient, she stated she decided to accept a SNF after speaking with her mother.  She confirms she would like to go to SNF.         Expected Discharge Plan and Services           Expected Discharge Date: 09/06/20                                     Social Determinants of Health (SDOH) Interventions    Readmission Risk Interventions No flowsheet data found.

## 2020-09-08 NOTE — Plan of Care (Signed)
  Problem: Education: Goal: Knowledge of General Education information will improve Description: Including pain rating scale, medication(s)/side effects and non-pharmacologic comfort measures Outcome: Progressing   Problem: Health Behavior/Discharge Planning: Goal: Ability to manage health-related needs will improve Outcome: Progressing   Problem: Clinical Measurements: Goal: Ability to maintain clinical measurements within normal limits will improve Outcome: Progressing Goal: Will remain free from infection Outcome: Progressing Goal: Diagnostic test results will improve Outcome: Progressing Goal: Respiratory complications will improve Outcome: Progressing Goal: Cardiovascular complication will be avoided Outcome: Progressing   Problem: Nutrition: Goal: Adequate nutrition will be maintained Outcome: Progressing   Problem: Coping: Goal: Level of anxiety will decrease Outcome: Progressing   Problem: Elimination: Goal: Will not experience complications related to bowel motility Outcome: Progressing Goal: Will not experience complications related to urinary retention Outcome: Progressing   Problem: Activity: Goal: Risk for activity intolerance will decrease Outcome: Progressing

## 2020-09-09 DIAGNOSIS — M25552 Pain in left hip: Secondary | ICD-10-CM | POA: Diagnosis not present

## 2020-09-09 MED ORDER — BICTEGRAVIR-EMTRICITAB-TENOFOV 50-200-25 MG PO TABS: 1.0000 | ORAL_TABLET | Freq: Every day | ORAL | Status: DC

## 2020-09-09 MED ORDER — OXYCODONE-ACETAMINOPHEN 7.5-325 MG PO TABS
1.0000 | ORAL_TABLET | Freq: Four times a day (QID) | ORAL | Status: DC | PRN
  Administered 2020-09-09 – 2020-09-16 (×13): 1 via ORAL
  Filled 2020-09-09 (×13): qty 1

## 2020-09-09 MED ORDER — AMPHETAMINE-DEXTROAMPHET ER 30 MG PO CP24
30.0000 mg | ORAL_CAPSULE | Freq: Two times a day (BID) | ORAL | Status: DC
  Administered 2020-09-10 – 2020-09-16 (×14): 30 mg via ORAL
  Filled 2020-09-09 (×13): qty 1

## 2020-09-09 MED ORDER — BICTEGRAVIR-EMTRICITAB-TENOFOV 50-200-25 MG PO TABS
1.0000 | ORAL_TABLET | Freq: Every day | ORAL | Status: DC
  Administered 2020-09-09 – 2020-09-15 (×7): 1 via ORAL
  Filled 2020-09-09 (×8): qty 1

## 2020-09-09 NOTE — Plan of Care (Signed)
Patient sleeping between care. Pain management ongoing. No new changes in assessment. Call bell within reach.  PLAN OF CARE ONGOING Problem: Education: Goal: Knowledge of General Education information will improve Description: Including pain rating scale, medication(s)/side effects and non-pharmacologic comfort measures Outcome: Progressing   Problem: Health Behavior/Discharge Planning: Goal: Ability to manage health-related needs will improve Outcome: Progressing   Problem: Clinical Measurements: Goal: Ability to maintain clinical measurements within normal limits will improve Outcome: Progressing Goal: Will remain free from infection Outcome: Progressing Goal: Diagnostic test results will improve Outcome: Progressing Goal: Respiratory complications will improve Outcome: Progressing Goal: Cardiovascular complication will be avoided Outcome: Progressing   Problem: Activity: Goal: Risk for activity intolerance will decrease Outcome: Progressing   Problem: Nutrition: Goal: Adequate nutrition will be maintained Outcome: Progressing   Problem: Coping: Goal: Level of anxiety will decrease Outcome: Progressing   Problem: Elimination: Goal: Will not experience complications related to bowel motility Outcome: Progressing Goal: Will not experience complications related to urinary retention Outcome: Progressing   Problem: Pain Managment: Goal: General experience of comfort will improve Outcome: Progressing   Problem: Safety: Goal: Ability to remain free from injury will improve Outcome: Progressing   Problem: Skin Integrity: Goal: Risk for impaired skin integrity will decrease Outcome: Progressing

## 2020-09-09 NOTE — Progress Notes (Signed)
PROGRESS NOTE    Nicole Logan  HMC:[id number] DOB: 27-Jun-1970 DOA: 09/05/2020 PCP: Cory Roughen, PA-C  131A/131A-BB   Assessment & Plan:   Principal Problem:   Left hip pain Active Problems:   Back pain   Fall at home, initial encounter   Left knee pain   Hypertension   CRPS (complex regional pain syndrome type I)   AKI (acute kidney injury) (HCC)   HIV (human immunodeficiency virus infection) (HCC)   Depression with anxiety   Knee pain, left   Nicole Logan is a 50 y.o. female with medical history significant of morbid obesity hypertension, complex regional pain syndrome type I, HIV infection, hypertension, morbid obesity with BMI 60.08, who presents with fall, pain in left hip, left knee, lower back.   Patient states that she accidentally fell on the driveway when she was out of the car this morning.  No loss of consciousness.     Left chronic ACL tear, chronic Left knee MCL sprain Left nondisplaced lateral meniscus tear. Acute popliteus and gastrocnemius muscle strains --as shown on MRI left knee.  Ortho consulted. Plan: --Per ortho, may remove the brace to work on bedside knee range of motion exercises but should wear the brace if she is standing or ambulating. --follow up in ortho clinic 2 to 3 weeks after discharge  Left hip pain, back pain  Sciatica CT of lumbar spine and left hip was without any acute abnormality, did show worsening of degenerative disease.  She also has lumbar spinal stenosis and degenerative disc disease.   Plan: --cont home opioids, all as PRN, do not increase the dose --cont Flexeril --cont Lyrica at increased dose of 75 mg TID   Fall at home, initial encounter -PT/OT   Hypertension:  BP has been wnl, even with home BP meds held --cont to hold home lasix and Benicar since BP normal without them.   CRPS (complex regional pain syndrome type I) on chronic opioids --cont home opioids all as PRN --do not increase --ensure  scheduled bowel regimen   AKI, ruled out Creatinine 1.18 on presentation.  Baseline ~1.  HIV (human immunodeficiency virus infection) (HCC):  Viral load <20 on 03/26/2016 --cont home Biktarvy   Depression with anxiety --cont home Xanax PRN    DVT prophylaxis: Lovenox SQ Code Status: Full code  Family Communication:  Level of care: Med-Surg Dispo:   The patient is from: home Anticipated d/c is to: SNF Anticipated d/c date is: whenever bed available Patient currently is ready for discharge.   Subjective and Interval History:  Pt reported hearing her left knee pop after standing up to wipe after voiding.  Complained of severe pain afterwards.   Objective: Vitals:   09/08/20 1940 09/09/20 0319 09/09/20 0740 09/09/20 1519  BP: (!) 99/33 (!) 111/56 92/60 129/76  Pulse: 97 92 93 (!) 104  Resp: 18 16 17 16   Temp: 98 F (36.7 C) 97.6 F (36.4 C) 98.2 F (36.8 C) 98.1 F (36.7 C)  TempSrc: Oral Oral Oral   SpO2: 99% 95% 98% 98%  Weight:      Height:        Intake/Output Summary (Last 24 hours) at 09/09/2020 1757 Last data filed at 09/09/2020 1358 Gross per 24 hour  Intake 720 ml  Output 800 ml  Net -80 ml   Filed Weights   09/04/20 1850  Weight: (!) 158.8 kg    Examination:   Constitutional: NAD, AAOx3 HEENT: conjunctivae and lids normal, EOMI CV:  No cyanosis.   RESP: normal respiratory effort, on RA Extremities: left LLE in brace SKIN: warm, dry Neuro: II - XII grossly intact.     Data Reviewed: I have personally reviewed following labs and imaging studies  CBC: Recent Labs  Lab 09/05/20 0248 09/05/20 1745  WBC 9.0 7.7  NEUTROABS 5.3 4.4  HGB 11.2* 11.2  HCT 35.8* 36.4  MCV 85.6 85  PLT 263 276   Basic Metabolic Panel: Recent Labs  Lab 09/05/20 0248 09/06/20 0403  NA 139 138  K 3.8 3.8  CL 105 104  CO2 27 25  GLUCOSE 146* 131*  BUN 17 18  CREATININE 1.18* 0.85  CALCIUM 8.9 9.1   GFR: Estimated Creatinine Clearance: 119 mL/min (by C-G  formula based on SCr of 0.85 mg/dL). Liver Function Tests: No results for input(s): AST, ALT, ALKPHOS, BILITOT, PROT, ALBUMIN in the last 168 hours. No results for input(s): LIPASE, AMYLASE in the last 168 hours. No results for input(s): AMMONIA in the last 168 hours. Coagulation Profile: No results for input(s): INR, PROTIME in the last 168 hours. Cardiac Enzymes: No results for input(s): CKTOTAL, CKMB, CKMBINDEX, TROPONINI in the last 168 hours. BNP (last 3 results) No results for input(s): PROBNP in the last 8760 hours. HbA1C: No results for input(s): HGBA1C in the last 72 hours. CBG: No results for input(s): GLUCAP in the last 168 hours. Lipid Profile: No results for input(s): CHOL, HDL, LDLCALC, TRIG, CHOLHDL, LDLDIRECT in the last 72 hours. Thyroid Function Tests: No results for input(s): TSH, T4TOTAL, FREET4, T3FREE, THYROIDAB in the last 72 hours. Anemia Panel: No results for input(s): VITAMINB12, FOLATE, FERRITIN, TIBC, IRON, RETICCTPCT in the last 72 hours. Sepsis Labs: No results for input(s): PROCALCITON, LATICACIDVEN in the last 168 hours.  Recent Results (from the past 240 hour(s))  Resp Panel by RT-PCR (Flu A&B, Covid) Nasopharyngeal Swab     Status: None   Collection Time: 09/05/20  8:13 PM   Specimen: Nasopharyngeal Swab; Nasopharyngeal(NP) swabs in vial transport medium  Result Value Ref Range Status   SARS Coronavirus 2 by RT PCR NEGATIVE NEGATIVE Final    Comment: (NOTE) SARS-CoV-2 target nucleic acids are NOT DETECTED.  The SARS-CoV-2 RNA is generally detectable in upper respiratory specimens during the acute phase of infection. The lowest concentration of SARS-CoV-2 viral copies this assay can detect is 138 copies/mL. A negative result does not preclude SARS-Cov-2 infection and should not be used as the sole basis for treatment or other patient management decisions. A negative result may occur with  improper specimen collection/handling, submission of  specimen other than nasopharyngeal swab, presence of viral mutation(s) within the areas targeted by this assay, and inadequate number of viral copies(<138 copies/mL). A negative result must be combined with clinical observations, patient history, and epidemiological information. The expected result is Negative.  Fact Sheet for Patients:  BloggerCourse.com  Fact Sheet for Healthcare Providers:  SeriousBroker.it  This test is no t yet approved or cleared by the [place] FDA and  has been authorized for detection and/or diagnosis of SARS-CoV-2 by FDA under an Emergency Use Authorization (EUA). This EUA will remain  in effect (meaning this test can be used) for the duration of the COVID-19 declaration under Section 564(b)(1) of the Act, 21 U.S.C.section 360bbb-3(b)(1), unless the authorization is terminated  or revoked sooner.       Influenza A by PCR NEGATIVE NEGATIVE Final   Influenza B by PCR NEGATIVE NEGATIVE Final    Comment: (NOTE) The  Xpert Xpress SARS-CoV-2/FLU/RSV plus assay is intended as an aid in the diagnosis of influenza from Nasopharyngeal swab specimens and should not be used as a sole basis for treatment. Nasal washings and aspirates are unacceptable for Xpert Xpress SARS-CoV-2/FLU/RSV testing.  Fact Sheet for Patients: BloggerCourse.com  Fact Sheet for Healthcare Providers: SeriousBroker.it  This test is not yet approved or cleared by the [place] FDA and has been authorized for detection and/or diagnosis of SARS-CoV-2 by FDA under an Emergency Use Authorization (EUA). This EUA will remain in effect (meaning this test can be used) for the duration of the COVID-19 declaration under Section 564(b)(1) of the Act, 21 U.S.C. section 360bbb-3(b)(1), unless the authorization is terminated or revoked.  Performed at [facility], [address] [place], [place] [address]       Radiology Studies: No results found.   Scheduled Meds:  allopurinol  300 mg Oral Daily   amphetamine-dextroamphetamine  30 mg Oral BID   [START ON 09/10/2020] bictegravir-emtricitabine-tenofovir AF  1 tablet Oral QHS   cyclobenzaprine  5 mg Oral BID   enoxaparin (LOVENOX) injection  0.5 mg/kg Subcutaneous Q24H   lurasidone  120 mg Oral QHS   multivitamin with minerals  1 tablet Oral Q1200   naloxegol oxalate  25 mg Oral Daily   pantoprazole  40 mg Oral BID   polyethylene glycol  17 g Oral BID   pregabalin  75 mg Oral TID   ziprasidone  40 mg Oral BID WC   Continuous Infusions:   LOS: 2 days     Darlin Priestly, MD [facility] If 7PM-7AM, please contact night-coverage 09/09/2020, 5:57 PM

## 2020-09-09 NOTE — Plan of Care (Signed)

## 2020-09-09 NOTE — Care Management Important Message (Signed)
Important Message  Patient Details  Name: Nicole Logan MRN: [id number] Date of Birth: September 15, 1970   Medicare Important Message Given:  Yes     Johnell Comings 09/09/2020, 11:50 AM

## 2020-09-09 NOTE — Progress Notes (Addendum)
Physical Therapy Treatment Patient Details Name: Nicole Logan MRN: [id number] DOB: Oct 28, 1970 Today's Date: 09/09/2020    History of Present Illness 50 y.o. female with PMH of HIV, hypertension, type 2 diabetes, NASH, peripheral neuropathy, IBS, migraines, hypothyroidism, hypercholesterolemia, hepatitis, and ADHD who presents with left knee/hip and leg pain, acute onset when the patient was standing and felt/heard a pop in her left knee.She could not bear weight and collapsed to the ground. 09/04/20 L knee imaging: No evidence of fracture, dislocation, or joint effusion. MRI 8/31 reveals acute and chronic ligimentus, meniscal and muscular strains/tears.    PT Comments    Pt making progress towards therapy goals and increasing her participation in therapy. Pt educated on proper donning L knee brace and how to unlock/lock brace. Pt encouraged to continue to move L LE and to perform transfers with nursing with brace locked to bedside commode to promote mobility and independence. Pt reporting 8/10 pain with ambulation of 20 ft in room with RW and CGA. Nurse notified of patient position and locking brace during mobility tasks. Pt will continue to benefit from skilled PT to improve independence with mobility.    Follow Up Recommendations  SNF;Supervision/Assistance - 24 hour     Equipment Recommendations  Rolling walker with 5" wheels;3in1 (PT)    Recommendations for Other Services       Precautions / Restrictions Precautions Precautions: Fall Required Braces or Orthoses: Knee Immobilizer - Left Knee Immobilizer - Left:  (Brace locked in WB) Restrictions Weight Bearing Restrictions: No LLE Weight Bearing: Weight bearing as tolerated Other Position/Activity Restrictions: knee immobilizer locked in extension during WBing/ambulation    Mobility  Bed Mobility Overal bed mobility: Needs Assistance Bed Mobility: Supine to Sit     Supine to sit: Supervision;HOB elevated     General  bed mobility comments: Improvement in movement in L LE off side of bed. Usage of bed rails for scooting towards EOB    Transfers Overall transfer level: Needs assistance Equipment used: Rolling walker (2 wheeled) Transfers: Sit to/from Stand Sit to Stand: From elevated surface;Min guard      General transfer comment: cues for placement of hands on bed for sit to stand however, pt stood with B UE usage on RW.  Ambulation/Gait Ambulation/Gait assistance: Min guard Gait Distance (Feet): 20 Feet Assistive device: Rolling walker (2 wheeled) Gait Pattern/deviations: Antalgic;Step-to pattern;Decreased step length - right;Decreased stance time - left     General Gait Details: Pt reporting increased pain with ambulation of short distance in room. Pt demonstrating good understanding of usage of RW.   Stairs Stairs:  (Deferred due to safety)           Wheelchair Mobility    Modified Rankin (Stroke Patients Only)       Balance Overall balance assessment: Needs assistance;History of Falls Sitting-balance support: No upper extremity supported;Feet supported Sitting balance-Leahy Scale: Good     Standing balance support: Bilateral upper extremity supported Standing balance-Leahy Scale: Fair        Cognition Arousal/Alertness: Awake/alert Behavior During Therapy: Anxious Overall Cognitive Status: Within Functional Limits for tasks assessed      Exercises General Exercises - Lower Extremity Ankle Circles/Pumps: AROM;15 reps Heel Slides: AROM;Left;10 reps Hip ABduction/ADduction: AROM;Right;Left;5 reps Straight Leg Raises: AROM;5 reps;Left Other Exercises Other Exercises: Adjusted patient's brace and educated pt on proper donning/doffing of brace and how to lock/unlock brace for mobility    General Comments        Pertinent Vitals/Pain Pain Assessment: Faces Faces  Pain Scale: Hurts whole lot Pain Location: L knee Pain Descriptors / Indicators:  Grimacing;Moaning;Shooting;Sharp Pain Intervention(s): Limited activity within patient's tolerance;Monitored during session;Premedicated before session;Utilized relaxation techniques    Home Living      Prior Function            PT Goals (current goals can now be found in the care plan section) Acute Rehab PT Goals Patient Stated Goal: control L knee and hip pain PT Goal Formulation: With patient Time For Goal Achievement: 09/20/20 Potential to Achieve Goals: Fair Progress towards PT goals: Progressing toward goals    Frequency    Min 2X/week      PT Plan Current plan remains appropriate    Co-evaluation      AM-PAC PT "6 Clicks" Mobility   Outcome Measure  Help needed turning from your back to your side while in a flat bed without using bedrails?: A Lot Help needed moving from lying on your back to sitting on the side of a flat bed without using bedrails?: A Lot Help needed moving to and from a bed to a chair (including a wheelchair)?: A Little Help needed standing up from a chair using your arms (e.g., wheelchair or bedside chair)?: A Little Help needed to walk in hospital room?: A Little Help needed climbing 3-5 steps with a railing? : Total 6 Click Score: 14    End of Session Equipment Utilized During Treatment: Gait belt Activity Tolerance: Patient tolerated treatment well;Patient limited by pain Patient left: in chair;with call bell/phone within reach;with chair alarm set Nurse Communication: Mobility status PT Visit Diagnosis: Difficulty in walking, not elsewhere classified (R26.2);Pain;Muscle weakness (generalized) (M62.81);Unsteadiness on feet (R26.81) Pain - Right/Left: Left Pain - part of body: Knee     Time: 4462-8638 PT Time Calculation (min) (ACUTE ONLY): 27 min  Charges:  $Therapeutic Exercise: 8-22 mins $Therapeutic Activity: 8-22 mins                     Verl Hildebran, SPT    Rushie Chestnut 09/09/2020, 2:29 PM

## 2020-09-10 ENCOUNTER — Other Ambulatory Visit: Payer: Self-pay

## 2020-09-10 DIAGNOSIS — M25552 Pain in left hip: Secondary | ICD-10-CM | POA: Diagnosis not present

## 2020-09-10 NOTE — Progress Notes (Signed)
  Subjective:  I saw the patient today.  She is lying in her hospital bed.  Patient has her hinged knee brace on.  Patient states that she reinjured her left knee yesterday when she fell in the restroom.  She questions whether she needs surgery for her ACL tear.  Objective:   VITALS:   Vitals:   09/09/20 1929 09/10/20 0358 09/10/20 0725 09/10/20 1627  BP: (!) 131/58 113/69 128/75 138/88  Pulse: 97 93 98 (!) 108  Resp: 17 19 17 19   Temp: 98.5 F (36.9 C) 97.6 F (36.4 C) 98.4 F (36.9 C) 97.9 F (36.6 C)  TempSrc:   Oral Oral  SpO2: 94% 93% 94% 94%  Weight:      Height:        PHYSICAL EXAM: Left lower extremity: Patient skin is intact.  Her leg and thigh compartments are soft and compressible.  Distally she is neurovascular intact.  There is no erythema ecchymosis swelling or deformity.  Her knee brace is locked in extension.  LABS  No results found for this or any previous visit (from the past 24 hour(s)).  No results found.  Assessment/Plan:     Principal Problem:   Left hip pain Active Problems:   Back pain   Fall at home, initial encounter   Left knee pain   Hypertension   CRPS (complex regional pain syndrome type I)   AKI (acute kidney injury) (HCC)   HIV (human immunodeficiency virus infection) (HCC)   Depression with anxiety   Knee pain, left  I explained to the patient that she has numerous issues with her left lower extremity contributing to her frequent falls.  She has an arthritic left hip joint as well as a chronic ACL tear.  However more importantly is her lower extremity weakness from deconditioning and her morbid obesity.  I explained to the patient that I would not recommend ACL reconstruction, but instead she needs to continue working with physical therapy and may need to do so for 6 to 12 months before she has regained enough strength to stabilize her left knee and to improve her gait and balance to prevent falls.  To attempt an ACL reconstruction at  this point while the patient is so deconditioned would almost certainly lead to retear of the graft due to her high fall risk.  Patient understands she will continue to need intensive physical therapy and will need to work hard on improving her lower extremity strength and conditioning.  Hopefully this can lead to weight loss.  Patient states that she had talked to her hip arthroplasty specialist who told her she needs to lose weight before she is a candidate for an arthroplasty.  The same would hold for anterior cruciate ligament reconstruction.  She is currently at a BMI of 60.  She would be at very high risk for any orthopedic surgery at this point.    I adjusted the patient's brace to allow for 90 degrees of flexion.  She is to keep the brace locked whenever she stands or ambulates.  She may unlock the brace to work on knee range of motion exercises with physical therapy.  Patient may weight-bear as tolerated on the left lower extremity as long as the brace is locked in extension to prevent her knee from buckling and her from falling.    , MD 09/10/2020, 4:59 PM

## 2020-09-10 NOTE — Progress Notes (Signed)
Physical Therapy Treatment Patient Details Name: Nicole Logan MRN: [id number] DOB: Apr 29, 1970 Today's Date: 09/10/2020    History of Present Illness 50 y.o. female with PMH of HIV, hypertension, type 2 diabetes, NASH, peripheral neuropathy, IBS, migraines, hypothyroidism, hypercholesterolemia, hepatitis, and ADHD who presents with left knee/hip and leg pain, acute onset when the patient was standing and felt/heard a pop in her left knee.She could not bear weight and collapsed to the ground. 09/04/20 L knee imaging: No evidence of fracture, dislocation, or joint effusion. MRI 8/31 reveals acute and chronic ligimentus, meniscal and muscular strains/tears.    PT Comments    Pt seen for PT tx with pt agreeable to session despite L knee pain. Pt is able to ambulate in room/bathroom with RW & close supervision, but requires assistance for peri hygiene after toileting 2/2 body habitus. Pt performs LLE strengthening exercises & pt reviewed locking/unlocking L knee brace with pt. Will continue to follow pt acutely to progress gait & stair negotiation as able.     Follow Up Recommendations  SNF;Supervision/Assistance - 24 hour     Equipment Recommendations  Rolling walker with 5" wheels;3in1 (PT) (bariatric size)    Recommendations for Other Services   OT consult    Precautions / Restrictions Precautions Precautions: Fall Required Braces or Orthoses: Knee Immobilizer - Left Knee Immobilizer - Left:  (brace locked in extension with weightbearing/gait) Restrictions Weight Bearing Restrictions: No LLE Weight Bearing: Weight bearing as tolerated Other Position/Activity Restrictions: knee immobilizer locked in extension during WBing/ambulation    Mobility  Bed Mobility Overal bed mobility: Needs Assistance Bed Mobility: Supine to Sit;Sit to Supine       Sit to supine: Supervision        Transfers Overall transfer level: Needs assistance Equipment used: Rolling walker (2  wheeled) Transfers: Sit to/from Stand Sit to Stand: Min guard (requires cuing for safe hand placement)            Ambulation/Gait Ambulation/Gait assistance: Supervision Gait Distance (Feet): 12 Feet (+ 12) Assistive device: Rolling walker (2 wheeled) Gait Pattern/deviations: Decreased stride length;Decreased stance time - left Gait velocity: decreased   General Gait Details: Decreased L hip & knee flexion during swing phase 2/2 L knee brace locked in extension   Stairs             Wheelchair Mobility    Modified Rankin (Stroke Patients Only)       Balance Overall balance assessment: Needs assistance;History of Falls Sitting-balance support: No upper extremity supported;Feet supported Sitting balance-Leahy Scale: Good     Standing balance support: Bilateral upper extremity supported;During functional activity Standing balance-Leahy Scale: Fair                              Cognition Arousal/Alertness: Awake/alert Behavior During Therapy: WFL for tasks assessed/performed Overall Cognitive Status: Within Functional Limits for tasks assessed                                        Exercises General Exercises - Lower Extremity Heel Slides: AROM;Left;10 reps;Supine Straight Leg Raises: AROM;Left;10 reps;Supine;Strengthening Other Exercises Other Exercises: Pt attempts to provide cuing for breathing while performing exercises but poor return demo from pt. Adjusted positioning of brace & educated pt on proper positioning. Reviewed locking/unlocking brace & reiterated importance of locked during weight bearing (pt with incidence yesterday of  toileting & feeling a pop - MDs made aware - with pt reporting she's unsure if she had brace locked or not). Wrote lock/unlock brace info on whiteboard in room.    General Comments General comments (skin integrity, edema, etc.): Pt with continent BM on toilet but requires dependent assist for peri  hygiene. Reports she gets creative at home, sometimes using a toilet brush - showed pt a picture of adaptive equipment to help with peri hygiene (will place OT consult)      Pertinent Vitals/Pain Pain Assessment: 0-10 Pain Score: 8  Pain Location: L knee Pain Descriptors / Indicators: Grimacing;Moaning Pain Intervention(s): Limited activity within patient's tolerance;Monitored during session    Home Living                      Prior Function            PT Goals (current goals can now be found in the care plan section) Acute Rehab PT Goals Patient Stated Goal: control L knee and hip pain PT Goal Formulation: With patient Time For Goal Achievement: 09/20/20 Potential to Achieve Goals: Fair Progress towards PT goals: Progressing toward goals    Frequency           PT Plan Current plan remains appropriate    Co-evaluation              AM-PAC PT "6 Clicks" Mobility   Outcome Measure  Help needed turning from your back to your side while in a flat bed without using bedrails?: A Little Help needed moving from lying on your back to sitting on the side of a flat bed without using bedrails?: A Little Help needed moving to and from a bed to a chair (including a wheelchair)?: A Little Help needed standing up from a chair using your arms (e.g., wheelchair or bedside chair)?: A Little Help needed to walk in hospital room?: A Little Help needed climbing 3-5 steps with a railing? : Total 6 Click Score: 16    End of Session   Activity Tolerance: Patient tolerated treatment well;Patient limited by pain Patient left: in bed;with call bell/phone within reach;with bed alarm set Nurse Communication: Mobility status PT Visit Diagnosis: Difficulty in walking, not elsewhere classified (R26.2);Pain;Muscle weakness (generalized) (M62.81);Unsteadiness on feet (R26.81) Pain - Right/Left: Left Pain - part of body: Knee     Time: 0981-1914 PT Time Calculation (min) (ACUTE  ONLY): 25 min  Charges:  $Therapeutic Activity: 23-37 mins                     Aleda Grana, PT, DPT 09/10/20, 1:53 PM    Sandi Mariscal 09/10/2020, 1:52 PM

## 2020-09-10 NOTE — Plan of Care (Signed)

## 2020-09-10 NOTE — Progress Notes (Signed)
PROGRESS NOTE    TEXANNA HILBURN  JYN:[id number] DOB: Jan 23, 1970 DOA: 09/05/2020 PCP: Cory Roughen, PA-C  131A/131A-BB   Assessment & Plan:   Principal Problem:   Left hip pain Active Problems:   Back pain   Fall at home, initial encounter   Left knee pain   Hypertension   CRPS (complex regional pain syndrome type I)   AKI (acute kidney injury) (HCC)   HIV (human immunodeficiency virus infection) (HCC)   Depression with anxiety   Knee pain, left   LAMYAH CREED is a 50 y.o. female with medical history significant of morbid obesity hypertension, complex regional pain syndrome type I, HIV infection, hypertension, morbid obesity with BMI 60.08, who presents with fall, pain in left hip, left knee, lower back.   Patient states that she accidentally fell on the driveway when she was out of the car this morning.  No loss of consciousness.     Left chronic ACL tear, chronic Left knee MCL sprain Left nondisplaced lateral meniscus tear. Acute popliteus and gastrocnemius muscle strains --as shown on MRI left knee.  Ortho consulted. Plan: --Per ortho, may remove the brace to work on bedside knee range of motion exercises but should wear the brace if she is standing or ambulating. --follow up in ortho clinic 2 to 3 weeks after discharge  Left hip pain, back pain  Sciatica CT of lumbar spine and left hip was without any acute abnormality, did show worsening of degenerative disease.  She also has lumbar spinal stenosis and degenerative disc disease.   Plan: --cont home opioids, all as PRN, do not increase the dose --cont Flexeril --cont Lyrica at increased dose of 75 mg TID   Fall at home, initial encounter -PT/OT   Hypertension:  BP has been wnl, even with home BP meds held --cont to hold home lasix and Benicar since BP normal without them.   CRPS (complex regional pain syndrome type I) on chronic opioids --cont home opioids all as PRN --do not increase --ensure  scheduled bowel regimen   AKI, ruled out Creatinine 1.18 on presentation.  Baseline ~1.  HIV (human immunodeficiency virus infection) (HCC):  Viral load <20 on 03/26/2016 --cont home Biktarvy   Depression with anxiety --cont home Xanax PRN    DVT prophylaxis: Lovenox SQ Code Status: Full code  Family Communication:  Level of care: Med-Surg Dispo:   The patient is from: home Anticipated d/c is to: SNF Anticipated d/c date is: whenever bed available Patient currently is ready for discharge.   Subjective and Interval History:  Continued to report severe pain in her left knee.  PT worked with pt and showed her how to lock the knee brace.    Pt asked for her egg allergy to be removed so she can have some egg products in her foods.     Objective: Vitals:   09/09/20 1929 09/10/20 0358 09/10/20 0725 09/10/20 1627  BP: (!) 131/58 113/69 128/75 138/88  Pulse: 97 93 98 (!) 108  Resp: 17 19 17 19   Temp: 98.5 F (36.9 C) 97.6 F (36.4 C) 98.4 F (36.9 C) 97.9 F (36.6 C)  TempSrc:   Oral Oral  SpO2: 94% 93% 94% 94%  Weight:      Height:        Intake/Output Summary (Last 24 hours) at 09/10/2020 1724 Last data filed at 09/10/2020 1628 Gross per 24 hour  Intake 0 ml  Output 1800 ml  Net -1800 ml   Filed  Weights   09/04/20 1850  Weight: (!) 158.8 kg    Examination:   Constitutional: NAD, AAOx3 HEENT: conjunctivae and lids normal, EOMI CV: No cyanosis.   RESP: normal respiratory effort, on RA Extremities: LLE in brace SKIN: warm, dry Neuro: II - XII grossly intact.     Data Reviewed: I have personally reviewed following labs and imaging studies  CBC: Recent Labs  Lab 09/05/20 0248 09/05/20 1745  WBC 9.0 7.7  NEUTROABS 5.3 4.4  HGB 11.2* 11.2  HCT 35.8* 36.4  MCV 85.6 85  PLT 263 276   Basic Metabolic Panel: Recent Labs  Lab 09/05/20 0248 09/06/20 0403  NA 139 138  K 3.8 3.8  CL 105 104  CO2 27 25  GLUCOSE 146* 131*  BUN 17 18  CREATININE 1.18*  0.85  CALCIUM 8.9 9.1   GFR: Estimated Creatinine Clearance: 119 mL/min (by C-G formula based on SCr of 0.85 mg/dL). Liver Function Tests: No results for input(s): AST, ALT, ALKPHOS, BILITOT, PROT, ALBUMIN in the last 168 hours. No results for input(s): LIPASE, AMYLASE in the last 168 hours. No results for input(s): AMMONIA in the last 168 hours. Coagulation Profile: No results for input(s): INR, PROTIME in the last 168 hours. Cardiac Enzymes: No results for input(s): CKTOTAL, CKMB, CKMBINDEX, TROPONINI in the last 168 hours. BNP (last 3 results) No results for input(s): PROBNP in the last 8760 hours. HbA1C: No results for input(s): HGBA1C in the last 72 hours. CBG: No results for input(s): GLUCAP in the last 168 hours. Lipid Profile: No results for input(s): CHOL, HDL, LDLCALC, TRIG, CHOLHDL, LDLDIRECT in the last 72 hours. Thyroid Function Tests: No results for input(s): TSH, T4TOTAL, FREET4, T3FREE, THYROIDAB in the last 72 hours. Anemia Panel: No results for input(s): VITAMINB12, FOLATE, FERRITIN, TIBC, IRON, RETICCTPCT in the last 72 hours. Sepsis Labs: No results for input(s): PROCALCITON, LATICACIDVEN in the last 168 hours.  Recent Results (from the past 240 hour(s))  Resp Panel by RT-PCR (Flu A&B, Covid) Nasopharyngeal Swab     Status: None   Collection Time: 09/05/20  8:13 PM   Specimen: Nasopharyngeal Swab; Nasopharyngeal(NP) swabs in vial transport medium  Result Value Ref Range Status   SARS Coronavirus 2 by RT PCR NEGATIVE NEGATIVE Final    Comment: (NOTE) SARS-CoV-2 target nucleic acids are NOT DETECTED.  The SARS-CoV-2 RNA is generally detectable in upper respiratory specimens during the acute phase of infection. The lowest concentration of SARS-CoV-2 viral copies this assay can detect is 138 copies/mL. A negative result does not preclude SARS-Cov-2 infection and should not be used as the sole basis for treatment or other patient management decisions. A  negative result may occur with  improper specimen collection/handling, submission of specimen other than nasopharyngeal swab, presence of viral mutation(s) within the areas targeted by this assay, and inadequate number of viral copies(<138 copies/mL). A negative result must be combined with clinical observations, patient history, and epidemiological information. The expected result is Negative.  Fact Sheet for Patients:  BloggerCourse.com  Fact Sheet for Healthcare Providers:  SeriousBroker.it  This test is no t yet approved or cleared by the [place] FDA and  has been authorized for detection and/or diagnosis of SARS-CoV-2 by FDA under an Emergency Use Authorization (EUA). This EUA will remain  in effect (meaning this test can be used) for the duration of the COVID-19 declaration under Section 564(b)(1) of the Act, 21 U.S.C.section 360bbb-3(b)(1), unless the authorization is terminated  or revoked sooner.  Influenza A by PCR NEGATIVE NEGATIVE Final   Influenza B by PCR NEGATIVE NEGATIVE Final    Comment: (NOTE) The Xpert Xpress SARS-CoV-2/FLU/RSV plus assay is intended as an aid in the diagnosis of influenza from Nasopharyngeal swab specimens and should not be used as a sole basis for treatment. Nasal washings and aspirates are unacceptable for Xpert Xpress SARS-CoV-2/FLU/RSV testing.  Fact Sheet for Patients: BloggerCourse.com  Fact Sheet for Healthcare Providers: SeriousBroker.it  This test is not yet approved or cleared by the [place] FDA and has been authorized for detection and/or diagnosis of SARS-CoV-2 by FDA under an Emergency Use Authorization (EUA). This EUA will remain in effect (meaning this test can be used) for the duration of the COVID-19 declaration under Section 564(b)(1) of the Act, 21 U.S.C. section 360bbb-3(b)(1), unless the authorization  is terminated or revoked.  Performed at [facility], [address], [place], [place] [address]       Radiology Studies: No results found.   Scheduled Meds:  allopurinol  300 mg Oral Daily   amphetamine-dextroamphetamine  30 mg Oral BID   bictegravir-emtricitabine-tenofovir AF  1 tablet Oral QHS   cyclobenzaprine  5 mg Oral BID   enoxaparin (LOVENOX) injection  0.5 mg/kg Subcutaneous Q24H   lurasidone  120 mg Oral QHS   multivitamin with minerals  1 tablet Oral Q1200   naloxegol oxalate  25 mg Oral Daily   pantoprazole  40 mg Oral BID   polyethylene glycol  17 g Oral BID   pregabalin  75 mg Oral TID   ziprasidone  40 mg Oral BID WC   Continuous Infusions:   LOS: 3 days     Darlin Priestly, MD [facility] If 7PM-7AM, please contact night-coverage 09/10/2020, 5:24 PM

## 2020-09-11 DIAGNOSIS — M25552 Pain in left hip: Secondary | ICD-10-CM | POA: Diagnosis not present

## 2020-09-11 NOTE — Plan of Care (Signed)

## 2020-09-11 NOTE — Progress Notes (Signed)
PROGRESS NOTE    Nicole Logan  MPN:[id number] DOB: 03-28-1970 DOA: 09/05/2020 PCP: Cory Roughen, PA-C  131A/131A-BB   Assessment & Plan:   Principal Problem:   Left hip pain Active Problems:   Back pain   Fall at home, initial encounter   Left knee pain   Hypertension   CRPS (complex regional pain syndrome type I)   AKI (acute kidney injury) (HCC)   HIV (human immunodeficiency virus infection) (HCC)   Depression with anxiety   Knee pain, left   Nicole Logan is a 50 y.o. female with medical history significant of morbid obesity hypertension, complex regional pain syndrome type I, HIV infection, hypertension, morbid obesity with BMI 60.08, who presents with fall, pain in left hip, left knee, lower back.   Patient states that she accidentally fell on the driveway when she was out of the car this morning.  No loss of consciousness.     Left chronic ACL tear, chronic Left knee MCL sprain Left nondisplaced lateral meniscus tear. Acute popliteus and gastrocnemius muscle strains --as shown on MRI left knee.  Ortho consulted.  Dr. Martha Clan does not recommend surgical intervention of pt's left knee currently. Plan: --Per ortho, may remove the brace to work on bedside knee range of motion exercises but should wear the brace if she is standing or ambulating. --Reach out to Dr. Allena Katz for 2nd opinion regarding surgical intervention --follow up in ortho clinic 2 to 3 weeks after discharge  Left hip pain, back pain  Sciatica CT of lumbar spine and left hip was without any acute abnormality, did show worsening of degenerative disease.  She also has lumbar spinal stenosis and degenerative disc disease.   Plan: --cont home opioids, all as PRN, do not increase the dose --cont Flexeril --cont Lyrica at increased dose of 75 mg TID   Fall at home, initial encounter -PT/OT   Hypertension:  BP has been wnl, even with home BP meds held --cont to hold home lasix and Benicar  since BP normal without them.   CRPS (complex regional pain syndrome type I) on chronic opioids --cont home opioids all as PRN --do not increase --scheduled bowel regimen   AKI, ruled out Creatinine 1.18 on presentation.  Baseline ~1.  HIV (human immunodeficiency virus infection) (HCC):  Viral load <20 on 03/26/2016 --cont home Biktarvy   Depression with anxiety --cont home Xanax PRN    DVT prophylaxis: Lovenox SQ Code Status: Full code  Family Communication:  Level of care: Med-Surg Dispo:   The patient is from: home Anticipated d/c is to: SNF Anticipated d/c date is: whenever bed available Patient currently is ready for discharge.   Subjective and Interval History:  Pt believed she needs surgical fix for her left knee, despite recommendation by ortho.  Pt wanted a 2nd opinion.   Objective: Vitals:   09/11/20 0519 09/11/20 0821 09/11/20 1122 09/11/20 1626  BP: 110/69 116/63 116/88 122/79  Pulse: 95 95 (!) 103 (!) 110  Resp: 19 20 16 20   Temp: 97.7 F (36.5 C) 97.7 F (36.5 C) 98.7 F (37.1 C) 97.6 F (36.4 C)  TempSrc: Oral     SpO2: 91% 95% 93% 97%  Weight:      Height:        Intake/Output Summary (Last 24 hours) at 09/11/2020 1634 Last data filed at 09/11/2020 0829 Gross per 24 hour  Intake --  Output 1100 ml  Net -1100 ml   Filed Weights   09/04/20 1850  Weight: (!) 158.8 kg    Examination:   Constitutional: NAD, AAOx3 HEENT: conjunctivae and lids normal, EOMI CV: No cyanosis.   RESP: normal respiratory effort, on RA Extremities: right knee in brace SKIN: warm, dry Neuro: II - XII grossly intact.     Data Reviewed: I have personally reviewed following labs and imaging studies  CBC: Recent Labs  Lab 09/05/20 0248 09/05/20 1745  WBC 9.0 7.7  NEUTROABS 5.3 4.4  HGB 11.2* 11.2  HCT 35.8* 36.4  MCV 85.6 85  PLT 263 276   Basic Metabolic Panel: Recent Labs  Lab 09/05/20 0248 09/06/20 0403  NA 139 138  K 3.8 3.8  CL 105 104  CO2  27 25  GLUCOSE 146* 131*  BUN 17 18  CREATININE 1.18* 0.85  CALCIUM 8.9 9.1   GFR: Estimated Creatinine Clearance: 119 mL/min (by C-G formula based on SCr of 0.85 mg/dL). Liver Function Tests: No results for input(s): AST, ALT, ALKPHOS, BILITOT, PROT, ALBUMIN in the last 168 hours. No results for input(s): LIPASE, AMYLASE in the last 168 hours. No results for input(s): AMMONIA in the last 168 hours. Coagulation Profile: No results for input(s): INR, PROTIME in the last 168 hours. Cardiac Enzymes: No results for input(s): CKTOTAL, CKMB, CKMBINDEX, TROPONINI in the last 168 hours. BNP (last 3 results) No results for input(s): PROBNP in the last 8760 hours. HbA1C: No results for input(s): HGBA1C in the last 72 hours. CBG: No results for input(s): GLUCAP in the last 168 hours. Lipid Profile: No results for input(s): CHOL, HDL, LDLCALC, TRIG, CHOLHDL, LDLDIRECT in the last 72 hours. Thyroid Function Tests: No results for input(s): TSH, T4TOTAL, FREET4, T3FREE, THYROIDAB in the last 72 hours. Anemia Panel: No results for input(s): VITAMINB12, FOLATE, FERRITIN, TIBC, IRON, RETICCTPCT in the last 72 hours. Sepsis Labs: No results for input(s): PROCALCITON, LATICACIDVEN in the last 168 hours.  Recent Results (from the past 240 hour(s))  Resp Panel by RT-PCR (Flu A&B, Covid) Nasopharyngeal Swab     Status: None   Collection Time: 09/05/20  8:13 PM   Specimen: Nasopharyngeal Swab; Nasopharyngeal(NP) swabs in vial transport medium  Result Value Ref Range Status   SARS Coronavirus 2 by RT PCR NEGATIVE NEGATIVE Final    Comment: (NOTE) SARS-CoV-2 target nucleic acids are NOT DETECTED.  The SARS-CoV-2 RNA is generally detectable in upper respiratory specimens during the acute phase of infection. The lowest concentration of SARS-CoV-2 viral copies this assay can detect is 138 copies/mL. A negative result does not preclude SARS-Cov-2 infection and should not be used as the sole basis for  treatment or other patient management decisions. A negative result may occur with  improper specimen collection/handling, submission of specimen other than nasopharyngeal swab, presence of viral mutation(s) within the areas targeted by this assay, and inadequate number of viral copies(<138 copies/mL). A negative result must be combined with clinical observations, patient history, and epidemiological information. The expected result is Negative.  Fact Sheet for Patients:  BloggerCourse.com  Fact Sheet for Healthcare Providers:  SeriousBroker.it  This test is no t yet approved or cleared by the [place] FDA and  has been authorized for detection and/or diagnosis of SARS-CoV-2 by FDA under an Emergency Use Authorization (EUA). This EUA will remain  in effect (meaning this test can be used) for the duration of the COVID-19 declaration under Section 564(b)(1) of the Act, 21 U.S.C.section 360bbb-3(b)(1), unless the authorization is terminated  or revoked sooner.       Influenza A  by PCR NEGATIVE NEGATIVE Final   Influenza B by PCR NEGATIVE NEGATIVE Final    Comment: (NOTE) The Xpert Xpress SARS-CoV-2/FLU/RSV plus assay is intended as an aid in the diagnosis of influenza from Nasopharyngeal swab specimens and should not be used as a sole basis for treatment. Nasal washings and aspirates are unacceptable for Xpert Xpress SARS-CoV-2/FLU/RSV testing.  Fact Sheet for Patients: BloggerCourse.com  Fact Sheet for Healthcare Providers: SeriousBroker.it  This test is not yet approved or cleared by the [place] FDA and has been authorized for detection and/or diagnosis of SARS-CoV-2 by FDA under an Emergency Use Authorization (EUA). This EUA will remain in effect (meaning this test can be used) for the duration of the COVID-19 declaration under Section 564(b)(1) of the Act, 21  U.S.C. section 360bbb-3(b)(1), unless the authorization is terminated or revoked.  Performed at [facility], [address] [place], [place] [address]       Radiology Studies: No results found.   Scheduled Meds:  allopurinol  300 mg Oral Daily   amphetamine-dextroamphetamine  30 mg Oral BID   bictegravir-emtricitabine-tenofovir AF  1 tablet Oral QHS   cyclobenzaprine  5 mg Oral BID   enoxaparin (LOVENOX) injection  0.5 mg/kg Subcutaneous Q24H   lurasidone  120 mg Oral QHS   multivitamin with minerals  1 tablet Oral Q1200   naloxegol oxalate  25 mg Oral Daily   pantoprazole  40 mg Oral BID   polyethylene glycol  17 g Oral BID   pregabalin  75 mg Oral TID   ziprasidone  40 mg Oral BID WC   Continuous Infusions:   LOS: 4 days     Darlin Priestly, MD [facility] If 7PM-7AM, please contact night-coverage 09/11/2020, 4:34 PM

## 2020-09-12 DIAGNOSIS — M25552 Pain in left hip: Secondary | ICD-10-CM | POA: Diagnosis not present

## 2020-09-12 NOTE — Progress Notes (Signed)
Occupational Therapy Treatment Patient Details Name: Nicole Logan MRN: [id number] DOB: [birth year] Today's Date: 09/12/2020    History of present illness 50 y.o. female with PMH of HIV, hypertension, type 2 diabetes, NASH, peripheral neuropathy, IBS, migraines, hypothyroidism, hypercholesterolemia, hepatitis, and ADHD who presents with left knee/hip and leg pain, acute onset when the patient was standing and felt/heard a pop in her left knee.She could not bear weight and collapsed to the ground. 09/04/20 L knee imaging: No evidence of fracture, dislocation, or joint effusion. MRI 8/31 reveals acute and chronic ligimentus, meniscal and muscular strains/tears.   OT comments  Pt seen for OT treatment on this date. Per discussion with PT, pt expressed interest in education regarding DME/AD for LB bathing and peri-care. Upon arrival to room, pt awake and seated upright in bed. At start of session, knee immobilizer incorrectly positioned and this author attempted to adjust for proper positioning and optimal knee support. Pt agreeable to OT tx. With SUPERVISION, pt able to perform bed mobility with use of bedrails and HOB elevated. While sitting EOB with SUPERVISION, pt educated on adaptive equipment for LB bathing and peri-care; pt verbalized understanding and handout provided. Pt attempted to demo long handled sponge this date via sit<>stand LB bathing, however following sit<>stand transfer, knee immobilizer moving from proper positioning. This author attempted to adjust immobilizer for proper positioning again, however straps appear to be too long; RN informed and pt left in bed in no acute distress. Pt continues to benefit from skilled OT services to maximize return to PLOF and minimize risk of future falls, injury, caregiver burden, and readmission. Will continue to follow POC. Discharge recommendation remains appropriate.     Follow Up Recommendations  SNF    Equipment Recommendations  3 in 1 bedside  commode;Wheelchair (measurements OT)       Precautions / Restrictions Precautions Precautions: Fall Required Braces or Orthoses: Knee Immobilizer - Left Knee Immobilizer - Left: Other (comment) (brace locked in extension with weightbearing/gait) Restrictions Weight Bearing Restrictions: Yes LLE Weight Bearing: Weight bearing as tolerated Other Position/Activity Restrictions: knee immobilizer locked in extension during WBing/ambulation       Mobility Bed Mobility Overal bed mobility: Needs Assistance Bed Mobility: Supine to Sit;Sit to Supine     Supine to sit: Supervision;HOB elevated Sit to supine: Supervision   General bed mobility comments: With use of bedrailds    Transfers Overall transfer level: Needs assistance Equipment used: Rolling walker (2 wheeled) Transfers: Sit to/from [employer] Sit to Stand: Min assist Stand pivot transfers: Min assist       General transfer comment: cues for placement of hands on bed for sit to stand however, pt stood with B UE usage on RW.    Balance Overall balance assessment: Needs assistance;History of Falls Sitting-balance support: No upper extremity supported;Feet supported Sitting balance-Leahy Scale: Good Sitting balance - Comments: Good sitting balance reaching within BOS at EOB   Standing balance support: Bilateral upper extremity supported;During functional activity Standing balance-Leahy Scale: Poor Standing balance comment: with b/l UE support from RW, requires MIN A for stand pivot transfer                           ADL either performed or assessed with clinical judgement   ADL Overall ADL's : Needs assistance/impaired                     Lower Body Dressing: Maximal assistance;Bed level  Toilet Transfer: Minimal assistance;Stand-pivot;BSC;RW   Toileting- Clothing Manipulation and Hygiene: Maximal assistance;Sit to/from stand                Cognition  Arousal/Alertness: Awake/alert Behavior During Therapy: WFL for tasks assessed/performed Overall Cognitive Status: Within Functional Limits for tasks assessed                                          Exercises Other Exercises Other Exercises: Pt educated on adaptive equipment for LB bathing and peri-care, pt verbalized understanding and handout provided. Attempted to simulate using long handled sponge this date, however unable d/t knee immobilzer requiring adjustment prior to additional sit>stand attempts (RN informed)           Pertinent Vitals/ Pain       Pain Assessment: Faces Faces Pain Scale: Hurts little more Pain Location: L knee Pain Descriptors / Indicators: Grimacing;Moaning Pain Intervention(s): Limited activity within patient's tolerance;Monitored during session;Repositioned         Frequency  Min 2X/week        Progress Toward Goals  OT Goals(current goals can now be found in the care plan section)  Progress towards OT goals: Progressing toward goals  Acute Rehab OT Goals Patient Stated Goal: control L knee and hip pain OT Goal Formulation: With patient Time For Goal Achievement: 09/20/20 Potential to Achieve Goals: Good  Plan Discharge plan remains appropriate;Frequency remains appropriate       AM-PAC OT "6 Clicks" Daily Activity     Outcome Measure   Help from another person eating meals?: None Help from another person taking care of personal grooming?: A Little Help from another person toileting, which includes using toliet, bedpan, or urinal?: A Lot Help from another person bathing (including washing, rinsing, drying)?: A Lot Help from another person to put on and taking off regular upper body clothing?: A Little Help from another person to put on and taking off regular lower body clothing?: A Lot 6 Click Score: 16    End of Session Equipment Utilized During Treatment: Rolling walker;Gait belt  OT Visit Diagnosis: Other  abnormalities of gait and mobility (R26.89);Muscle weakness (generalized) (M62.81)   Activity Tolerance Patient tolerated treatment well   Patient Left in bed;with call bell/phone within reach;with bed alarm set   Nurse Communication Mobility status;Other (comment) (knee immobilizer requiring adjustment/shortening of straps)        Time: 1100-1125 OT Time Calculation (min): 25 min  Charges: OT General Charges $OT Visit: 1 Visit OT Treatments $Self Care/Home Management : 23-37 mins  Matthew Folks, OTR/L [facility] [phone]

## 2020-09-12 NOTE — Progress Notes (Signed)
Physical Therapy Treatment Patient Details Name: Nicole Logan MRN: [id number] DOB: [birth year] Today's Date: 09/12/2020    History of Present Illness 50 y.o. female with PMH of HIV, hypertension, type 2 diabetes, NASH, peripheral neuropathy, IBS, migraines, hypothyroidism, hypercholesterolemia, hepatitis, and ADHD who presents with left knee/hip and leg pain, acute onset when the patient was standing and felt/heard a pop in her left knee.She could not bear weight and collapsed to the ground. 09/04/20 L knee imaging: No evidence of fracture, dislocation, or joint effusion. MRI 8/31 reveals acute and chronic ligimentus, meniscal and muscular strains/tears.    PT Comments    Pt seen for PT tx to allow PT to assess brace as OT reported it was loose. PT repositioned brace on LLE & adjusted straps with PT educating pt on need to readjust position of brace vs changing length of straps as brace is currently fitted properly. Reviewed locking/unlocking brace with pt. Pt declines OOB activity at this time but is agreeable to bed level exercises & performs them as noted below with c/o increased pain & rest provided. Encouraged pt to get OOB to chair & pt reports she sat in chair earlier today.    Follow Up Recommendations  SNF;Supervision/Assistance - 24 hour     Equipment Recommendations  Rolling walker with 5" wheels;3in1 (PT) (bariatric)    Recommendations for Other Services       Precautions / Restrictions Precautions Precautions: Fall Required Braces or Orthoses: Knee Immobilizer - Left Knee Immobilizer - Left: Other (comment) Restrictions Weight Bearing Restrictions: Yes LLE Weight Bearing: Weight bearing as tolerated Other Position/Activity Restrictions: knee immobilizer locked in extension during WBing/ambulation    Mobility  Bed Mobility Overal bed mobility: Needs Assistance Bed Mobility: Supine to Sit;Sit to Supine     Supine to sit: Supervision;HOB elevated Sit to supine:  Supervision   General bed mobility comments: With use of bedrailds    Transfers Overall transfer level: Needs assistance Equipment used: Rolling walker (2 wheeled) Transfers: Sit to/from [employer] Sit to Stand: Min assist Stand pivot transfers: Min assist       General transfer comment: cues for placement of hands on bed for sit to stand however, pt stood with B UE usage on RW.  Ambulation/Gait                 Stairs             Wheelchair Mobility    Modified Rankin (Stroke Patients Only)       Balance Overall balance assessment: Needs assistance;History of Falls Sitting-balance support: No upper extremity supported;Feet supported Sitting balance-Leahy Scale: Good Sitting balance - Comments: Good sitting balance reaching within BOS at EOB   Standing balance support: Bilateral upper extremity supported;During functional activity Standing balance-Leahy Scale: Poor Standing balance comment: with b/l UE support from RW, requires MIN A for stand pivot transfer                            Cognition Arousal/Alertness: Awake/alert Behavior During Therapy: WFL for tasks assessed/performed Overall Cognitive Status: Within Functional Limits for tasks assessed                                 General Comments: Requires extra time to recall how to lock/unlock brace      Exercises General Exercises - Lower Extremity Heel Slides: AROM;Left;10 reps;Supine Hip ABduction/ADduction:  AROM;Left;10 reps;Supine;Strengthening Straight Leg Raises: AROM;Left;10 reps;Supine;Strengthening Other Exercises Other Exercises: Pt educated on adaptive equipment for LB bathing and peri-care, pt verbalized understanding and handout provided. Attempted to simulate using long handled sponge this date, however unable d/t knee immobilzer requiring adjustment prior to additional sit>stand attempts (RN informed)    General Comments         Pertinent Vitals/Pain Pain Assessment: Faces Faces Pain Scale: Hurts whole lot Pain Location: L knee/hip Pain Descriptors / Indicators: Grimacing;Moaning Pain Intervention(s): Monitored during session;Repositioned (rest provided)    Home Living                      Prior Function            PT Goals (current goals can now be found in the care plan section) Acute Rehab PT Goals Patient Stated Goal: control L knee and hip pain PT Goal Formulation: With patient Time For Goal Achievement: 09/20/20 Potential to Achieve Goals: Fair Progress towards PT goals: Progressing toward goals    Frequency    Min 2X/week      PT Plan Current plan remains appropriate    Co-evaluation              AM-PAC PT "6 Clicks" Mobility   Outcome Measure  Help needed turning from your back to your side while in a flat bed without using bedrails?: A Little Help needed moving from lying on your back to sitting on the side of a flat bed without using bedrails?: A Little Help needed moving to and from a bed to a chair (including a wheelchair)?: A Little Help needed standing up from a chair using your arms (e.g., wheelchair or bedside chair)?: A Little Help needed to walk in hospital room?: A Little Help needed climbing 3-5 steps with a railing? : Total 6 Click Score: 16    End of Session   Activity Tolerance: Patient limited by pain Patient left: in bed;with call bell/phone within reach;with bed alarm set   PT Visit Diagnosis: Difficulty in walking, not elsewhere classified (R26.2);Pain;Muscle weakness (generalized) (M62.81);Unsteadiness on feet (R26.81) Pain - Right/Left: Left Pain - part of body: Knee;Hip     Time: 4580-9983 PT Time Calculation (min) (ACUTE ONLY): 10 min  Charges:  $Therapeutic Activity: 8-22 mins                     Aleda Grana, PT, DPT 09/12/20, 2:52 PM    Sandi Mariscal 09/12/2020, 2:51 PM

## 2020-09-12 NOTE — Progress Notes (Signed)
PROGRESS NOTE    Nicole Logan  TIW:[id number] DOB: [birth year] DOA: 09/05/2020 PCP: Cory Roughen, PA-C  131A/131A-BB   Assessment & Plan:   Principal Problem:   Left hip pain Active Problems:   Back pain   Fall at home, initial encounter   Left knee pain   Hypertension   CRPS (complex regional pain syndrome type I)   AKI (acute kidney injury) (HCC)   HIV (human immunodeficiency virus infection) (HCC)   Depression with anxiety   Knee pain, left   Nicole Logan is a 50 y.o. female with medical history significant of morbid obesity hypertension, complex regional pain syndrome type I, HIV infection, hypertension, morbid obesity with BMI 60.08, who presents with fall, pain in left hip, left knee, lower back.   Patient states that she accidentally fell on the driveway when she was out of the car this morning.  No loss of consciousness.     Left chronic ACL tear, chronic Left knee MCL sprain Left nondisplaced lateral meniscus tear. Acute popliteus and gastrocnemius muscle strains --as shown on MRI left knee.  Ortho consulted.  Dr. Martha Clan does not recommend surgical intervention of pt's left knee currently.  2nd opinion requested, and obtained from Dr. Allena Katz who agreed with Dr. Samuel Germany assessment. Plan: --Per ortho, may remove the brace to work on bedside knee range of motion exercises but should wear the brace if she is standing or ambulating. --follow up in ortho clinic 2 to 3 weeks after discharge  Left hip pain, back pain  Sciatica CT of lumbar spine and left hip was without any acute abnormality, did show worsening of degenerative disease.  She also has lumbar spinal stenosis and degenerative disc disease.   Plan: --cont home opioids, all as PRN, do not increase the dose --cont Flexeril --cont Lyrica at increased dose of 75 mg TID   Fall at home, initial encounter -PT/OT   Hypertension:  BP has been wnl, even with home BP meds held --cont to hold  home lasix and Benicar since BP normal without them.   CRPS (complex regional pain syndrome type I) on chronic opioids --cont home opioids all as PRN --do not increase --scheduled bowel regimen   AKI, ruled out Creatinine 1.18 on presentation.  Baseline ~1.  HIV (human immunodeficiency virus infection) (HCC):  Viral load <20 on 03/26/2016 --cont home Biktarvy   Depression with anxiety --cont home Xanax PRN    DVT prophylaxis: Lovenox SQ Code Status: Full code  Family Communication:  Level of care: Med-Surg Dispo:   The patient is from: home Anticipated d/c is to: SNF Anticipated d/c date is: whenever bed available Patient currently is ready for discharge.   Subjective and Interval History:  Pt was up for a large BM and complained of severe knee pain afterwards.  PT saw pt to adjust her brace.  Discussed with pt that I asked Dr. Allena Katz for 2nd opinion for surgical intervention, and Dr. Allena Katz agreed with Dr. Martha Clan that pt would need extensive PT and strengthening before attempting surgical fix.   Objective: Vitals:   09/11/20 1626 09/11/20 1918 09/12/20 0406 09/12/20 1136  BP: 122/79 127/84 105/65 115/82  Pulse: (!) 110 (!) 107 100 (!) 101  Resp: 20 20 19 15   Temp: 97.6 F (36.4 C) 98.9 F (37.2 C) 98.2 F (36.8 C) 98.6 F (37 C)  TempSrc:  Oral Oral   SpO2: 97% 93% 92% 94%  Weight:      Height:  Intake/Output Summary (Last 24 hours) at 09/12/2020 1537 Last data filed at 09/12/2020 0406 Gross per 24 hour  Intake --  Output 750 ml  Net -750 ml   Filed Weights   09/04/20 1850  Weight: (!) 158.8 kg    Examination:   Constitutional: NAD, AAOx3 HEENT: conjunctivae and lids normal, EOMI CV: No cyanosis.   RESP: normal respiratory effort, on RA Extremities: right knee in brace SKIN: warm, dry Neuro: II - XII grossly intact.     Data Reviewed: I have personally reviewed following labs and imaging studies  CBC: Recent Labs  Lab 09/05/20 1745   WBC 7.7  NEUTROABS 4.4  HGB 11.2  HCT 36.4  MCV 85  PLT 276   Basic Metabolic Panel: Recent Labs  Lab 09/06/20 0403  NA 138  K 3.8  CL 104  CO2 25  GLUCOSE 131*  BUN 18  CREATININE 0.85  CALCIUM 9.1   GFR: Estimated Creatinine Clearance: 119 mL/min (by C-G formula based on SCr of 0.85 mg/dL). Liver Function Tests: No results for input(s): AST, ALT, ALKPHOS, BILITOT, PROT, ALBUMIN in the last 168 hours. No results for input(s): LIPASE, AMYLASE in the last 168 hours. No results for input(s): AMMONIA in the last 168 hours. Coagulation Profile: No results for input(s): INR, PROTIME in the last 168 hours. Cardiac Enzymes: No results for input(s): CKTOTAL, CKMB, CKMBINDEX, TROPONINI in the last 168 hours. BNP (last 3 results) No results for input(s): PROBNP in the last 8760 hours. HbA1C: No results for input(s): HGBA1C in the last 72 hours. CBG: No results for input(s): GLUCAP in the last 168 hours. Lipid Profile: No results for input(s): CHOL, HDL, LDLCALC, TRIG, CHOLHDL, LDLDIRECT in the last 72 hours. Thyroid Function Tests: No results for input(s): TSH, T4TOTAL, FREET4, T3FREE, THYROIDAB in the last 72 hours. Anemia Panel: No results for input(s): VITAMINB12, FOLATE, FERRITIN, TIBC, IRON, RETICCTPCT in the last 72 hours. Sepsis Labs: No results for input(s): PROCALCITON, LATICACIDVEN in the last 168 hours.  Recent Results (from the past 240 hour(s))  Resp Panel by RT-PCR (Flu A&B, Covid) Nasopharyngeal Swab     Status: None   Collection Time: 09/05/20  8:13 PM   Specimen: Nasopharyngeal Swab; Nasopharyngeal(NP) swabs in vial transport medium  Result Value Ref Range Status   SARS Coronavirus 2 by RT PCR NEGATIVE NEGATIVE Final    Comment: (NOTE) SARS-CoV-2 target nucleic acids are NOT DETECTED.  The SARS-CoV-2 RNA is generally detectable in upper respiratory specimens during the acute phase of infection. The lowest concentration of SARS-CoV-2 viral copies this  assay can detect is 138 copies/mL. A negative result does not preclude SARS-Cov-2 infection and should not be used as the sole basis for treatment or other patient management decisions. A negative result may occur with  improper specimen collection/handling, submission of specimen other than nasopharyngeal swab, presence of viral mutation(s) within the areas targeted by this assay, and inadequate number of viral copies(<138 copies/mL). A negative result must be combined with clinical observations, patient history, and epidemiological information. The expected result is Negative.  Fact Sheet for Patients:  BloggerCourse.com  Fact Sheet for Healthcare Providers:  SeriousBroker.it  This test is no t yet approved or cleared by the [place] FDA and  has been authorized for detection and/or diagnosis of SARS-CoV-2 by FDA under an Emergency Use Authorization (EUA). This EUA will remain  in effect (meaning this test can be used) for the duration of the COVID-19 declaration under Section 564(b)(1) of the Act,  21 U.S.C.section 360bbb-3(b)(1), unless the authorization is terminated  or revoked sooner.       Influenza A by PCR NEGATIVE NEGATIVE Final   Influenza B by PCR NEGATIVE NEGATIVE Final    Comment: (NOTE) The Xpert Xpress SARS-CoV-2/FLU/RSV plus assay is intended as an aid in the diagnosis of influenza from Nasopharyngeal swab specimens and should not be used as a sole basis for treatment. Nasal washings and aspirates are unacceptable for Xpert Xpress SARS-CoV-2/FLU/RSV testing.  Fact Sheet for Patients: BloggerCourse.com  Fact Sheet for Healthcare Providers: SeriousBroker.it  This test is not yet approved or cleared by the [place] FDA and has been authorized for detection and/or diagnosis of SARS-CoV-2 by FDA under an Emergency Use Authorization (EUA). This EUA will  remain in effect (meaning this test can be used) for the duration of the COVID-19 declaration under Section 564(b)(1) of the Act, 21 U.S.C. section 360bbb-3(b)(1), unless the authorization is terminated or revoked.  Performed at [facility], [address] [place], [place] [address]       Radiology Studies: No results found.   Scheduled Meds:  allopurinol  300 mg Oral Daily   amphetamine-dextroamphetamine  30 mg Oral BID   bictegravir-emtricitabine-tenofovir AF  1 tablet Oral QHS   cyclobenzaprine  5 mg Oral BID   enoxaparin (LOVENOX) injection  0.5 mg/kg Subcutaneous Q24H   lurasidone  120 mg Oral QHS   multivitamin with minerals  1 tablet Oral Q1200   naloxegol oxalate  25 mg Oral Daily   pantoprazole  40 mg Oral BID   polyethylene glycol  17 g Oral BID   pregabalin  75 mg Oral TID   ziprasidone  40 mg Oral BID WC   Continuous Infusions:   LOS: 5 days     Darlin Priestly, MD [facility] If 7PM-7AM, please contact night-coverage 09/12/2020, 3:37 PM

## 2020-09-12 NOTE — Plan of Care (Signed)

## 2020-09-12 NOTE — TOC Progression Note (Signed)
Transition of Care [facility]) - Progression Note    Patient Details  Name: Nicole Logan MRN: [id number] Date of Birth: 07/15/1970  [facility] of [facility]) CM/SW Contact  Caryn Section, RN Phone Number: 09/12/2020, 9:55 AM  Clinical Narrative:   No SNF bed offers, will check after the holiday.         Expected Discharge Plan and Services           Expected Discharge Date: 09/06/20                                     Social Determinants of Health (SDOH) Interventions    Readmission Risk Interventions No flowsheet data found.

## 2020-09-13 DIAGNOSIS — M25552 Pain in left hip: Secondary | ICD-10-CM | POA: Diagnosis not present

## 2020-09-13 LAB — CBC
HCT: 35.5 % — ABNORMAL LOW (ref 36.0–46.0)
Hemoglobin: 10.8 g/dL — ABNORMAL LOW (ref 12.0–15.0)
MCH: 26.2 pg (ref 26.0–34.0)
MCHC: 30.4 g/dL (ref 30.0–36.0)
MCV: 86 fL (ref 80.0–100.0)
Platelets: 296 10*3/uL (ref 150–400)
RBC: 4.13 MIL/uL (ref 3.87–5.11)
RDW: 15.9 % — ABNORMAL HIGH (ref 11.5–15.5)
WBC: 6.1 10*3/uL (ref 4.0–10.5)
nRBC: 0 % (ref 0.0–0.2)

## 2020-09-13 MED ORDER — CYCLOBENZAPRINE HCL 10 MG PO TABS
5.0000 mg | ORAL_TABLET | Freq: Three times a day (TID) | ORAL | Status: DC | PRN
  Administered 2020-09-14 – 2020-09-16 (×4): 5 mg via ORAL
  Filled 2020-09-13 (×4): qty 1

## 2020-09-13 NOTE — Progress Notes (Signed)
Occupational Therapy Treatment Patient Details Name: Nicole Logan MRN: [id number] DOB: 1970-04-09 Today's Date: 09/13/2020    History of present illness 50 y.o. female with PMH of HIV, hypertension, type 2 diabetes, NASH, peripheral neuropathy, IBS, migraines, hypothyroidism, hypercholesterolemia, hepatitis, and ADHD who presents with left knee/hip and leg pain, acute onset when the patient was standing and felt/heard a pop in her left knee.She could not bear weight and collapsed to the ground. 09/04/20 L knee imaging: No evidence of fracture, dislocation, or joint effusion. MRI 8/31 reveals acute and chronic ligimentus, meniscal and muscular strains/tears.   OT comments  Pt seen for OT treatment on this date. Upon arrival to room, pt awake and seated upright in bed. Pt agreeable to OT tx. With [facility] lowered and with use of bedrails, pt able to perform bed mobility with SUPERVISION. Following stand pivot transfer to recliner with RW and MIN GUARD, pt instructed on adaptive equipment for LB ADLs (i.e., sock aide and long handled bottom wiper). With sock aide, pt able to don socks with MIN A. With long handled bottom wiper, pt able to simulate sit>stand posterior peri-care with MIN A and unilateral UE support from RW. Following second sit>stand transfer, pt reporting "feeling like my knee is able to give out" and declined further mobility. Pt left in recliner with all needs within reach. Pt would benefit from continued skilled OT services to maximize return to PLOF and minimize risk of future falls, injury, caregiver burden, and readmission. Will continue to follow POC. Discharge recommendation remains appropriate.     Follow Up Recommendations  SNF    Equipment Recommendations  3 in 1 bedside commode;Wheelchair (measurements OT)       Precautions / Restrictions Precautions Precautions: Fall Required Braces or Orthoses: Knee Immobilizer - Left Knee Immobilizer - Left: Other (comment) (knee  immobilizer locked in extension during WBing/ambulation) Restrictions Weight Bearing Restrictions: Yes LLE Weight Bearing: Weight bearing as tolerated Other Position/Activity Restrictions: knee immobilizer locked in extension during WBing/ambulation       Mobility Bed Mobility Overal bed mobility: Needs Assistance Bed Mobility: Supine to Sit     Supine to sit: Supervision     General bed mobility comments: With use of bedrailds    Transfers Overall transfer level: Needs assistance Equipment used: Rolling walker (2 wheeled) Transfers: Sit to/from [employer] Sit to Stand: Min guard Stand pivot transfers: Min guard       General transfer comment: cues for safe hand placement    Balance Overall balance assessment: Needs assistance;History of Falls Sitting-balance support: No upper extremity supported;Feet supported Sitting balance-Leahy Scale: Good Sitting balance - Comments: Good sitting balance while using sock aide to don socks   Standing balance support: Single extremity supported;During functional activity Standing balance-Leahy Scale: Fair Standing balance comment: with unilateral UE support from RW, requires MIN GUARD for sit>stand peri-care                           ADL either performed or assessed with clinical judgement   ADL Overall ADL's : Needs assistance/impaired                     Lower Body Dressing: Moderate assistance;Sitting/lateral leans Lower Body Dressing Details (indicate cue type and reason): MAX A to doff socks. MIN A to don socks with sock aide     Toileting- Clothing Manipulation and Hygiene: Minimal assistance;Sit to/from stand Toileting - Architect Details (indicate  cue type and reason): Simulated posterior peri-care with long-handled bottom wiper; MIN A to apply toilet paper on AD and MIN GUARD for standing balance     Functional mobility during ADLs: Min guard;Rolling walker (stand pivot  transfer)        Cognition Arousal/Alertness: Awake/alert Behavior During Therapy: WFL for tasks assessed/performed Overall Cognitive Status: Within Functional Limits for tasks assessed                                 General Comments: Pt A&Ox4. Requires increased processing time        Exercises Other Exercises Other Exercises: Pt educated on additional adaptive equipment for LB ADLs (i.e., sock aide and long handled bottom wiper). Pt demonstrated good understanding           Pertinent Vitals/ Pain       Pain Assessment: Faces Faces Pain Scale: Hurts whole lot Pain Location: L knee/hip Pain Descriptors / Indicators: Grimacing;Moaning Pain Intervention(s): Limited activity within patient's tolerance;Monitored during session;Repositioned         Frequency  Min 2X/week        Progress Toward Goals  OT Goals(current goals can now be found in the care plan section)  Progress towards OT goals: Progressing toward goals  Acute Rehab OT Goals Patient Stated Goal: control L knee and hip pain OT Goal Formulation: With patient Time For Goal Achievement: 09/20/20 Potential to Achieve Goals: Good  Plan Discharge plan remains appropriate;Frequency remains appropriate       AM-PAC OT "6 Clicks" Daily Activity     Outcome Measure   Help from another person eating meals?: None Help from another person taking care of personal grooming?: A Little Help from another person toileting, which includes using toliet, bedpan, or urinal?: A Little Help from another person bathing (including washing, rinsing, drying)?: A Lot Help from another person to put on and taking off regular upper body clothing?: A Little Help from another person to put on and taking off regular lower body clothing?: A Lot 6 Click Score: 17    End of Session Equipment Utilized During Treatment: Rolling walker;Gait belt  OT Visit Diagnosis: Other abnormalities of gait and mobility  (R26.89);Muscle weakness (generalized) (M62.81)   Activity Tolerance Patient tolerated treatment well   Patient Left in chair;with call bell/phone within reach;with chair alarm set   Nurse Communication Mobility status        Time: 4627-0350 OT Time Calculation (min): 23 min  Charges: OT General Charges $OT Visit: 1 Visit OT Treatments $Self Care/Home Management : 23-37 mins  Matthew Folks, OTR/L [facility] [phone]

## 2020-09-13 NOTE — Progress Notes (Signed)
Vitals entered manually, [employer] wouldn't recognize employee ID

## 2020-09-13 NOTE — Plan of Care (Signed)
Patient alert and oriented x 4, complains of acute pain to left lower extremity, relieved by prn and scheduled pain medications. Vitals stable, no respiratory distress on room air. Will continue to monitor.  Problem: Education: Goal: Knowledge of General Education information will improve Description: Including pain rating scale, medication(s)/side effects and non-pharmacologic comfort measures Outcome: Progressing   Problem: Health Behavior/Discharge Planning: Goal: Ability to manage health-related needs will improve Outcome: Progressing   Problem: Clinical Measurements: Goal: Ability to maintain clinical measurements within normal limits will improve Outcome: Progressing Goal: Will remain free from infection Outcome: Progressing Goal: Diagnostic test results will improve Outcome: Progressing Goal: Respiratory complications will improve Outcome: Progressing Goal: Cardiovascular complication will be avoided Outcome: Progressing   Problem: Activity: Goal: Risk for activity intolerance will decrease Outcome: Progressing   Problem: Nutrition: Goal: Adequate nutrition will be maintained Outcome: Progressing   Problem: Coping: Goal: Level of anxiety will decrease Outcome: Progressing   Problem: Elimination: Goal: Will not experience complications related to bowel motility Outcome: Progressing Goal: Will not experience complications related to urinary retention Outcome: Progressing   Problem: Pain Managment: Goal: General experience of comfort will improve Outcome: Progressing

## 2020-09-13 NOTE — Progress Notes (Signed)
PROGRESS NOTE    Nicole Logan  MHD:[id number] DOB: May 14, 1970 DOA: 09/05/2020 PCP: Cory Roughen, PA-C  131A/131A-BB   Assessment & Plan:   Principal Problem:   Left hip pain Active Problems:   Back pain   Fall at home, initial encounter   Left knee pain   Hypertension   CRPS (complex regional pain syndrome type I)   AKI (acute kidney injury) (HCC)   HIV (human immunodeficiency virus infection) (HCC)   Depression with anxiety   Knee pain, left   Nicole Logan is a 50 y.o. female with medical history significant of morbid obesity hypertension, complex regional pain syndrome type I, HIV infection, hypertension, morbid obesity with BMI 60.08, who presents with fall, pain in left hip, left knee, lower back.   Patient states that she accidentally fell on the driveway when she was out of the car this morning.  No loss of consciousness.     Left chronic ACL tear, chronic Left knee MCL sprain Left nondisplaced lateral meniscus tear. Acute popliteus and gastrocnemius muscle strains --as shown on MRI left knee.  Ortho consulted.  Dr. Martha Clan does not recommend surgical intervention of pt's left knee currently.  2nd opinion requested, and obtained from Dr. Allena Katz who agreed with Dr. Samuel Germany assessment. Plan: --Per ortho, may remove the brace to work on bedside knee range of motion exercises but should wear the brace if she is standing or ambulating. --follow up in ortho clinic 2 to 3 weeks after discharge  Left hip pain, back pain  Sciatica CT of lumbar spine and left hip was without any acute abnormality, did show worsening of degenerative disease.  She also has lumbar spinal stenosis and degenerative disc disease.   Plan: --cont home opioids, all as PRN, do not increase the dose --Flexeril PRN --cont Lyrica at increased dose of 75 mg TID   Fall at home, initial encounter -PT/OT   Hypertension:  BP has been wnl, even with home BP meds held --cont to hold home  lasix and Benicar since BP normal without them.   CRPS (complex regional pain syndrome type I) on chronic opioids --cont home opioids all as PRN --do not increase --scheduled bowel regimen   AKI, ruled out Creatinine 1.18 on presentation.  Baseline ~1.  HIV (human immunodeficiency virus infection) (HCC):  Viral load <20 on 03/26/2016 --cont home Biktarvy   Depression with anxiety --cont home Xanax PRN    DVT prophylaxis: Lovenox SQ Code Status: Full code  Family Communication:  Level of care: Med-Surg Dispo:   The patient is from: home Anticipated d/c is to: SNF Anticipated d/c date is: whenever bed available Patient currently is ready for discharge.   Subjective and Interval History:  Pt reported not doing well again, this time was focused on someone contacting her PCP to order her [facility] services.  I reminded pt that she is currently being considered for SNF rehab, and so we wouldn't be ordering HH services.  Pt also complained of her knee brace keep sliding down.  I contacted PT/OT to help train the RN how to adjust the knee brace, so it can be done PRN.   Objective: Vitals:   09/12/20 1935 09/13/20 0031 09/13/20 0558 09/13/20 0741  BP: 115/74 (!) 102/59 117/73 108/77  Pulse: (!) 103 99 91 91  Resp: 18 19 19 15   Temp: (!) 97.5 F (36.4 C) (!) 97.5 F (36.4 C) (!) 97.5 F (36.4 C) 97.7 F (36.5 C)  TempSrc: Oral  SpO2: 92% 92% 96% 93%  Weight:      Height:        Intake/Output Summary (Last 24 hours) at 09/13/2020 1514 Last data filed at 09/13/2020 1411 Gross per 24 hour  Intake 120 ml  Output 1100 ml  Net -980 ml   Filed Weights   09/04/20 1850  Weight: (!) 158.8 kg    Examination:   Constitutional: NAD, AAOx3 HEENT: conjunctivae and lids normal, EOMI CV: No cyanosis.   RESP: normal respiratory effort, on RA Extremities: left knee in brace SKIN: warm, dry Neuro: II - XII grossly intact.     Data Reviewed: I have personally reviewed following  labs and imaging studies  CBC: Recent Labs  Lab 09/13/20 0540  WBC 6.1  HGB 10.8*  HCT 35.5*  MCV 86.0  PLT 296   Basic Metabolic Panel: No results for input(s): NA, K, CL, CO2, GLUCOSE, BUN, CREATININE, CALCIUM, MG, PHOS in the last 168 hours.  GFR: Estimated Creatinine Clearance: 119 mL/min (by C-G formula based on SCr of 0.85 mg/dL). Liver Function Tests: No results for input(s): AST, ALT, ALKPHOS, BILITOT, PROT, ALBUMIN in the last 168 hours. No results for input(s): LIPASE, AMYLASE in the last 168 hours. No results for input(s): AMMONIA in the last 168 hours. Coagulation Profile: No results for input(s): INR, PROTIME in the last 168 hours. Cardiac Enzymes: No results for input(s): CKTOTAL, CKMB, CKMBINDEX, TROPONINI in the last 168 hours. BNP (last 3 results) No results for input(s): PROBNP in the last 8760 hours. HbA1C: No results for input(s): HGBA1C in the last 72 hours. CBG: No results for input(s): GLUCAP in the last 168 hours. Lipid Profile: No results for input(s): CHOL, HDL, LDLCALC, TRIG, CHOLHDL, LDLDIRECT in the last 72 hours. Thyroid Function Tests: No results for input(s): TSH, T4TOTAL, FREET4, T3FREE, THYROIDAB in the last 72 hours. Anemia Panel: No results for input(s): VITAMINB12, FOLATE, FERRITIN, TIBC, IRON, RETICCTPCT in the last 72 hours. Sepsis Labs: No results for input(s): PROCALCITON, LATICACIDVEN in the last 168 hours.  Recent Results (from the past 240 hour(s))  Resp Panel by RT-PCR (Flu A&B, Covid) Nasopharyngeal Swab     Status: None   Collection Time: 09/05/20  8:13 PM   Specimen: Nasopharyngeal Swab; Nasopharyngeal(NP) swabs in vial transport medium  Result Value Ref Range Status   SARS Coronavirus 2 by RT PCR NEGATIVE NEGATIVE Final    Comment: (NOTE) SARS-CoV-2 target nucleic acids are NOT DETECTED.  The SARS-CoV-2 RNA is generally detectable in upper respiratory specimens during the acute phase of infection. The  lowest concentration of SARS-CoV-2 viral copies this assay can detect is 138 copies/mL. A negative result does not preclude SARS-Cov-2 infection and should not be used as the sole basis for treatment or other patient management decisions. A negative result may occur with  improper specimen collection/handling, submission of specimen other than nasopharyngeal swab, presence of viral mutation(s) within the areas targeted by this assay, and inadequate number of viral copies(<138 copies/mL). A negative result must be combined with clinical observations, patient history, and epidemiological information. The expected result is Negative.  Fact Sheet for Patients:  BloggerCourse.com  Fact Sheet for Healthcare Providers:  SeriousBroker.it  This test is no t yet approved or cleared by the [place] FDA and  has been authorized for detection and/or diagnosis of SARS-CoV-2 by FDA under an Emergency Use Authorization (EUA). This EUA will remain  in effect (meaning this test can be used) for the duration of the COVID-19 declaration  under Section 564(b)(1) of the Act, 21 U.S.C.section 360bbb-3(b)(1), unless the authorization is terminated  or revoked sooner.       Influenza A by PCR NEGATIVE NEGATIVE Final   Influenza B by PCR NEGATIVE NEGATIVE Final    Comment: (NOTE) The Xpert Xpress SARS-CoV-2/FLU/RSV plus assay is intended as an aid in the diagnosis of influenza from Nasopharyngeal swab specimens and should not be used as a sole basis for treatment. Nasal washings and aspirates are unacceptable for Xpert Xpress SARS-CoV-2/FLU/RSV testing.  Fact Sheet for Patients: BloggerCourse.com  Fact Sheet for Healthcare Providers: SeriousBroker.it  This test is not yet approved or cleared by the [place] FDA and has been authorized for detection and/or diagnosis of SARS-CoV-2 by FDA under  an Emergency Use Authorization (EUA). This EUA will remain in effect (meaning this test can be used) for the duration of the COVID-19 declaration under Section 564(b)(1) of the Act, 21 U.S.C. section 360bbb-3(b)(1), unless the authorization is terminated or revoked.  Performed at [facility], [address] [place], [place] [address]       Radiology Studies: No results found.   Scheduled Meds:  allopurinol  300 mg Oral Daily   amphetamine-dextroamphetamine  30 mg Oral BID   bictegravir-emtricitabine-tenofovir AF  1 tablet Oral QHS   cyclobenzaprine  5 mg Oral BID   enoxaparin (LOVENOX) injection  0.5 mg/kg Subcutaneous Q24H   lurasidone  120 mg Oral QHS   multivitamin with minerals  1 tablet Oral Q1200   naloxegol oxalate  25 mg Oral Daily   pantoprazole  40 mg Oral BID   polyethylene glycol  17 g Oral BID   pregabalin  75 mg Oral TID   ziprasidone  40 mg Oral BID WC   Continuous Infusions:   LOS: 6 days     Darlin Priestly, MD [facility] If 7PM-7AM, please contact night-coverage 09/13/2020, 3:14 PM

## 2020-09-14 DIAGNOSIS — M25552 Pain in left hip: Secondary | ICD-10-CM | POA: Diagnosis not present

## 2020-09-14 NOTE — Progress Notes (Signed)
Physical Therapy Treatment Patient Details Name: Nicole Logan MRN: [id number] DOB: Jul 09, 1970 Today's Date: 09/14/2020    History of Present Illness 50 y.o. female with PMH of HIV, hypertension, type 2 diabetes, NASH, peripheral neuropathy, IBS, migraines, hypothyroidism, hypercholesterolemia, hepatitis, and ADHD who presents with left knee/hip and leg pain, acute onset when the patient was standing and felt/heard a pop in her left knee.She could not bear weight and collapsed to the ground. 09/04/20 L knee imaging: No evidence of fracture, dislocation, or joint effusion. MRI 8/31 reveals acute and chronic ligimentus, meniscal and muscular strains/tears.    PT Comments    Pt is currently making progress towards therapy goals. Pt able to ambulate in room with CGA + L knee brace donned and locked in extension. Pt demonstrating some fear avoidance behaviors regarding movement of her L knee and able to complete exercises with verbal encouragement and pain science education. Pt educated again on proper donning of L knee brace. Nurse present and demonstrates understanding of locking mechanisms and positioning to improve brace placement. Pt will benefit from further skilled PT services to improve ROM, strength, and functional mobility.   Follow Up Recommendations  SNF;Supervision/Assistance - 24 hour     Equipment Recommendations  Rolling walker with 5" wheels;3in1 (PT)    Recommendations for Other Services       Precautions / Restrictions Precautions Precautions: Fall Restrictions Weight Bearing Restrictions: Yes LLE Weight Bearing: Weight bearing as tolerated    Mobility  Bed Mobility Overal bed mobility: Needs Assistance Bed Mobility: Supine to Sit     Supine to sit: Supervision     General bed mobility comments: Able to manage B LE off EOB and pull to sit using bed rails    Transfers Overall transfer level: Needs assistance Equipment used: Rolling walker (2  wheeled) Transfers: Sit to/from Stand Sit to Stand: Min guard         General transfer comment: Cues for handplacement for pushing off from bed however, pt reports increased ease with B UE on RW. Requiring minA for stabilizing RW prior to transfer  Ambulation/Gait Ambulation/Gait assistance: Min guard Gait Distance (Feet): 20 Feet Assistive device: Rolling walker (2 wheeled) Gait Pattern/deviations: Step-to pattern;Decreased stride length;Antalgic     General Gait Details: Pt reporting some increased pain in L knee during ambulation and can only tolerate short distances at this time.   Stairs     Wheelchair Mobility    Modified Rankin (Stroke Patients Only)       Balance   Sitting-balance support: No upper extremity supported;Feet supported Sitting balance-Leahy Scale: Good     Standing balance support: Bilateral upper extremity supported;During functional activity Standing balance-Leahy Scale: Fair         Cognition Arousal/Alertness: Awake/alert Behavior During Therapy: WFL for tasks assessed/performed Overall Cognitive Status: Within Functional Limits for tasks assessed           Exercises Other Exercises Other Exercises: Pt educated on proper brace placement prior to movement and strategies to improve positioning (ie. tightening middle two straps of brace, lifting her leg to improve thigh clearance, and the knee hinge being at the knee joint). Pt unable to reach brace locking mechanism independently in supine position and demonstrates difficulty with reaching brace in sitting. Nurse present for knee brace education and reported no questions or difficulties with assisting patient with donning/doffing brace. Other Exercises: Seated therex AROM L LE only: Hip flexion 1 x 10, knee LAQ 1 x 10, ankle pumps 1 x 10  General Comments        Pertinent Vitals/Pain Pain Assessment: Faces Faces Pain Scale: Hurts little more Pain Location: L knee/hip Pain  Descriptors / Indicators: Grimacing;Guarding Pain Intervention(s): Limited activity within patient's tolerance;Monitored during session    Home Living         Prior Function            PT Goals (current goals can now be found in the care plan section) Acute Rehab PT Goals Patient Stated Goal: To be able to get out of room and progress ambulation PT Goal Formulation: With patient Time For Goal Achievement: 09/20/20 Potential to Achieve Goals: Fair Progress towards PT goals: Progressing toward goals    Frequency    Min 2X/week      PT Plan Current plan remains appropriate    Co-evaluation              AM-PAC PT "6 Clicks" Mobility   Outcome Measure  Help needed turning from your back to your side while in a flat bed without using bedrails?: A Little Help needed moving from lying on your back to sitting on the side of a flat bed without using bedrails?: A Little Help needed moving to and from a bed to a chair (including a wheelchair)?: A Little Help needed standing up from a chair using your arms (e.g., wheelchair or bedside chair)?: A Little Help needed to walk in hospital room?: A Little Help needed climbing 3-5 steps with a railing? : Total 6 Click Score: 16    End of Session Equipment Utilized During Treatment: Gait belt Activity Tolerance: Patient limited by pain Patient left: in chair;with chair alarm set;with call bell/phone within reach Nurse Communication: Mobility status;Other (comment) (Proper brace fitting and management) PT Visit Diagnosis: Difficulty in walking, not elsewhere classified (R26.2);Pain;Muscle weakness (generalized) (M62.81);Unsteadiness on feet (R26.81) Pain - Right/Left: Left Pain - part of body: Knee;Hip     Time: 9833-8250 PT Time Calculation (min) (ACUTE ONLY): 30 min  Charges:  $Gait Training: 8-22 mins $Therapeutic Exercise: 8-22 mins                     Verl Cliett, SPT   Verl Quest 09/14/2020, 3:53 PM

## 2020-09-14 NOTE — Progress Notes (Signed)
Patient ID: Nicole Logan, female   DOB: 1970-02-21, 50 y.o.   MRN: [id number]  PROGRESS NOTE    Nicole Logan  WTU:[id number] DOB: 1970/01/17 DOA: 09/05/2020 PCP: Cory Roughen, PA-C   Brief Narrative:  50 year old female with history of morbid obesity, hypertension, complex regional pain syndrome type I, HIV, hypertension presented with fall resulting in pain in left hip, left knee and lower back.  She was found to have left chronic ACL tear, left knee MCL sprain and left nondisplaced lateral meniscal tear.  Orthopedics recommended conservative management.  PT recommended SNF placement.  She is currently stable for discharge to SNF.  Assessment & Plan:   Left chronic ACL tear Left knee MCL sprain Left nondisplaced lateral meniscus tear Acute popliteus and gastrocnemius muscle strains -As per MRI of left knee. -Orthopedics recommended conservative management.  Second opinion was requested as per patient's request and Dr. Allena Katz agreed with Dr. Samuel Germany assessment -Per Ortho: may remove the brace to work on bedside knee range of motion exercises but should wear the brace if she is standing or ambulating.  Follow-up in Ortho clinic in 2 to 3 weeks after discharge.  Left hip pain/back pain Sciatica -CT of lumbar spine and left hip was without any acute abnormality, did show worsening of degenerative disease.  She also has lumbar spinal stenosis and degenerative disc disease. -Continue home opioids as needed.  Continue Flexeril as needed along with Lyrica  Fall at home  -PT OT recommend SNF placement.  Social worker following.  Hypertension -Blood pressure stable.  Off antihypertensives.  Chronic regional pain syndrome type I on chronic opioids -Continue home opiates but as as needed.  Continue bowel regimen.  HIV -Continue Biktarvy.  Outpatient follow-up with ID  Depression with anxiety -Continue home Xanax as needed   DVT prophylaxis: Lovenox Code Status:  Full Family Communication: None at bedside Disposition Plan: Status is: Inpatient  Remains inpatient appropriate because:Inpatient level of care appropriate due to severity of illness  Dispo: The patient is from: Home              Anticipated d/c is to: SNF              Patient currently is medically stable to d/c.   Difficult to place patient Yes   Consultants: Orthopedics  Procedures: None  Antimicrobials: None   Subjective: Patient seen and examined at bedside.  No overnight fever, vomiting reported.  Still complains of knee pain.  Objective: Vitals:   09/13/20 1554 09/13/20 1931 09/14/20 0006 09/14/20 0412  BP: 138/68 111/77 116/73 102/71  Pulse: 87 97 93 92  Resp: 16 15 18 18   Temp: 98.1 F (36.7 C) 98.4 F (36.9 C) 97.8 F (36.6 C) 98.1 F (36.7 C)  TempSrc: Oral     SpO2: 98% 93% 93% 92%  Weight:      Height:        Intake/Output Summary (Last 24 hours) at 09/14/2020 1102 Last data filed at 09/14/2020 1101 Gross per 24 hour  Intake 360 ml  Output 1100 ml  Net -740 ml   Filed Weights   09/04/20 1850  Weight: (!) 158.8 kg    Examination:  General exam: Appears calm and comfortable.  Looks chronically ill.  Currently on room air.  Poor historian. Respiratory system: Bilateral decreased breath sounds at bases Cardiovascular system: S1 & S2 heard, Rate controlled Gastrointestinal system: Abdomen is nondistended, soft and nontender. Normal bowel sounds heard. Extremities: No cyanosis, clubbing; trace  lower extremity edema present.  Left knee in brace.   Data Reviewed: I have personally reviewed following labs and imaging studies  CBC: Recent Labs  Lab 09/13/20 0540  WBC 6.1  HGB 10.8*  HCT 35.5*  MCV 86.0  PLT 296   Basic Metabolic Panel: No results for input(s): NA, K, CL, CO2, GLUCOSE, BUN, CREATININE, CALCIUM, MG, PHOS in the last 168 hours. GFR: Estimated Creatinine Clearance: 119 mL/min (by C-G formula based on SCr of 0.85 mg/dL). Liver  Function Tests: No results for input(s): AST, ALT, ALKPHOS, BILITOT, PROT, ALBUMIN in the last 168 hours. No results for input(s): LIPASE, AMYLASE in the last 168 hours. No results for input(s): AMMONIA in the last 168 hours. Coagulation Profile: No results for input(s): INR, PROTIME in the last 168 hours. Cardiac Enzymes: No results for input(s): CKTOTAL, CKMB, CKMBINDEX, TROPONINI in the last 168 hours. BNP (last 3 results) No results for input(s): PROBNP in the last 8760 hours. HbA1C: No results for input(s): HGBA1C in the last 72 hours. CBG: No results for input(s): GLUCAP in the last 168 hours. Lipid Profile: No results for input(s): CHOL, HDL, LDLCALC, TRIG, CHOLHDL, LDLDIRECT in the last 72 hours. Thyroid Function Tests: No results for input(s): TSH, T4TOTAL, FREET4, T3FREE, THYROIDAB in the last 72 hours. Anemia Panel: No results for input(s): VITAMINB12, FOLATE, FERRITIN, TIBC, IRON, RETICCTPCT in the last 72 hours. Sepsis Labs: No results for input(s): PROCALCITON, LATICACIDVEN in the last 168 hours.  Recent Results (from the past 240 hour(s))  Resp Panel by RT-PCR (Flu A&B, Covid) Nasopharyngeal Swab     Status: None   Collection Time: 09/05/20  8:13 PM   Specimen: Nasopharyngeal Swab; Nasopharyngeal(NP) swabs in vial transport medium  Result Value Ref Range Status   SARS Coronavirus 2 by RT PCR NEGATIVE NEGATIVE Final    Comment: (NOTE) SARS-CoV-2 target nucleic acids are NOT DETECTED.  The SARS-CoV-2 RNA is generally detectable in upper respiratory specimens during the acute phase of infection. The lowest concentration of SARS-CoV-2 viral copies this assay can detect is 138 copies/mL. A negative result does not preclude SARS-Cov-2 infection and should not be used as the sole basis for treatment or other patient management decisions. A negative result may occur with  improper specimen collection/handling, submission of specimen other than nasopharyngeal swab,  presence of viral mutation(s) within the areas targeted by this assay, and inadequate number of viral copies(<138 copies/mL). A negative result must be combined with clinical observations, patient history, and epidemiological information. The expected result is Negative.  Fact Sheet for Patients:  BloggerCourse.com  Fact Sheet for Healthcare Providers:  SeriousBroker.it  This test is no t yet approved or cleared by the [place] FDA and  has been authorized for detection and/or diagnosis of SARS-CoV-2 by FDA under an Emergency Use Authorization (EUA). This EUA will remain  in effect (meaning this test can be used) for the duration of the COVID-19 declaration under Section 564(b)(1) of the Act, 21 U.S.C.section 360bbb-3(b)(1), unless the authorization is terminated  or revoked sooner.       Influenza A by PCR NEGATIVE NEGATIVE Final   Influenza B by PCR NEGATIVE NEGATIVE Final    Comment: (NOTE) The Xpert Xpress SARS-CoV-2/FLU/RSV plus assay is intended as an aid in the diagnosis of influenza from Nasopharyngeal swab specimens and should not be used as a sole basis for treatment. Nasal washings and aspirates are unacceptable for Xpert Xpress SARS-CoV-2/FLU/RSV testing.  Fact Sheet for Patients: BloggerCourse.com  Fact Sheet for  Healthcare Providers: SeriousBroker.it  This test is not yet approved or cleared by the [place] and has been authorized for detection and/or diagnosis of SARS-CoV-2 by FDA under an Emergency Use Authorization (EUA). This EUA will remain in effect (meaning this test can be used) for the duration of the COVID-19 declaration under Section 564(b)(1) of the Act, 21 U.S.C. section 360bbb-3(b)(1), unless the authorization is terminated or revoked.  Performed at [facility], [address] [place], [place] [address]           Radiology Studies: No results found.      Scheduled Meds:  allopurinol  300 mg Oral Daily   amphetamine-dextroamphetamine  30 mg Oral BID   bictegravir-emtricitabine-tenofovir AF  1 tablet Oral QHS   enoxaparin (LOVENOX) injection  0.5 mg/kg Subcutaneous Q24H   lurasidone  120 mg Oral QHS   multivitamin with minerals  1 tablet Oral Q1200   naloxegol oxalate  25 mg Oral Daily   pantoprazole  40 mg Oral BID   polyethylene glycol  17 g Oral BID   pregabalin  75 mg Oral TID   ziprasidone  40 mg Oral BID WC   Continuous Infusions:        Glade Lloyd, MD [facility] 09/14/2020, 11:02 AM

## 2020-09-14 NOTE — Plan of Care (Signed)
Patient alert and oriented x 4, complains of acute pain to left lower extremity, relieved by prn and scheduled pain medications. Vitals stable, no respiratory distress on room air. Will continue to monitor.  Problem: Education: Goal: Knowledge of General Education information will improve Description: Including pain rating scale, medication(s)/side effects and non-pharmacologic comfort measures Outcome: Progressing   Problem: Health Behavior/Discharge Planning: Goal: Ability to manage health-related needs will improve Outcome: Progressing   Problem: Clinical Measurements: Goal: Ability to maintain clinical measurements within normal limits will improve Outcome: Progressing Goal: Will remain free from infection Outcome: Progressing Goal: Diagnostic test results will improve Outcome: Progressing Goal: Respiratory complications will improve Outcome: Progressing Goal: Cardiovascular complication will be avoided Outcome: Progressing   Problem: Activity: Goal: Risk for activity intolerance will decrease Outcome: Progressing   Problem: Nutrition: Goal: Adequate nutrition will be maintained Outcome: Progressing   Problem: Elimination: Goal: Will not experience complications related to bowel motility Outcome: Progressing Goal: Will not experience complications related to urinary retention Outcome: Progressing   Problem: Pain Managment: Goal: General experience of comfort will improve Outcome: Progressing   Problem: Skin Integrity: Goal: Risk for impaired skin integrity will decrease Outcome: Progressing

## 2020-09-15 DIAGNOSIS — M25552 Pain in left hip: Secondary | ICD-10-CM | POA: Diagnosis not present

## 2020-09-15 NOTE — Plan of Care (Signed)
No acute events this shift. °Problem: Education: °Goal: Knowledge of General Education information will improve °Description: Including pain rating scale, medication(s)/side effects and non-pharmacologic comfort measures °Outcome: Progressing °  °Problem: Health Behavior/Discharge Planning: °Goal: Ability to manage health-related needs will improve °Outcome: Progressing °  °Problem: Clinical Measurements: °Goal: Ability to maintain clinical measurements within normal limits will improve °Outcome: Progressing °Goal: Will remain free from infection °Outcome: Progressing °Goal: Diagnostic test results will improve °Outcome: Progressing °Goal: Respiratory complications will improve °Outcome: Progressing °Goal: Cardiovascular complication will be avoided °Outcome: Progressing °  °Problem: Activity: °Goal: Risk for activity intolerance will decrease °Outcome: Progressing °  °Problem: Nutrition: °Goal: Adequate nutrition will be maintained °Outcome: Progressing °  °Problem: Coping: °Goal: Level of anxiety will decrease °Outcome: Progressing °  °Problem: Elimination: °Goal: Will not experience complications related to bowel motility °Outcome: Progressing °Goal: Will not experience complications related to urinary retention °Outcome: Progressing °  °Problem: Pain Managment: °Goal: General experience of comfort will improve °Outcome: Progressing °  °Problem: Safety: °Goal: Ability to remain free from injury will improve °Outcome: Progressing °  °Problem: Skin Integrity: °Goal: Risk for impaired skin integrity will decrease °Outcome: Progressing °  °

## 2020-09-15 NOTE — Progress Notes (Signed)
Occupational Therapy Treatment Patient Details Name: Nicole Logan MRN: [id number] DOB: 10/01/1970 Today's Date: 09/15/2020    History of present illness 50 y.o. female with PMH of HIV, hypertension, type 2 diabetes, NASH, peripheral neuropathy, IBS, migraines, hypothyroidism, hypercholesterolemia, hepatitis, and ADHD who presents with left knee/hip and leg pain, acute onset when the patient was standing and felt/heard a pop in her left knee.She could not bear weight and collapsed to the ground. 09/04/20 L knee imaging: No evidence of fracture, dislocation, or joint effusion. MRI 8/31 reveals acute and chronic ligimentus, meniscal and muscular strains/tears.   OT comments  Ms Wynn was seen for OT treatment on this date. Upon arrival to room pt reclined in bed agreeable to session. Pt instructed in AE use for LBD seated EOB. CGA + RW for [facility] t/f - assist for RW mgmt and cues for safety. MAX A + RW for perihygiene in standing. MIN A hair brushing standing at EOC - mulitple small LOBs requiring assist and 1 posterior LOB causing pt to elect to return to sitting. MAX A adjust brace. Pt making good progress toward goals. Pt continues to benefit from skilled OT services to maximize return to PLOF and minimize risk of future falls, injury, caregiver burden, and readmission. Will continue to follow POC. Discharge recommendation remains appropriate.    Follow Up Recommendations  SNF    Equipment Recommendations  3 in 1 bedside commode    Recommendations for Other Services      Precautions / Restrictions Precautions Precautions: Fall Required Braces or Orthoses: Knee Immobilizer - Left Knee Immobilizer - Left:  (brace locked in extension for WBing) Restrictions Weight Bearing Restrictions: Yes LLE Weight Bearing: Weight bearing as tolerated       Mobility Bed Mobility Overal bed mobility: Needs Assistance Bed Mobility: Supine to Sit     Supine to sit: Supervision           Transfers Overall transfer level: Needs assistance Equipment used: Rolling walker (2 wheeled) Transfers: Sit to/from Stand Sit to Stand: Min guard              Balance Overall balance assessment: Needs assistance;History of Falls Sitting-balance support: No upper extremity supported;Feet supported Sitting balance-Leahy Scale: Good     Standing balance support: No upper extremity supported;During functional activity Standing balance-Leahy Scale: Poor Standing balance comment: multiple LOBs                           ADL either performed or assessed with clinical judgement   ADL Overall ADL's : Needs assistance/impaired                                       General ADL Comments: CGA + RW for [facility] t/f - assist for RW mgmt and cues for safety. MAX A + RW for perihygiene in standing. MIN A hair brushing standing at EOC - mulitple small LOBs requiring assist and 1 posterior LOB causing pt to elect to return to sitting. MAX A adjust brace      Cognition Arousal/Alertness: Awake/alert Behavior During Therapy: WFL for tasks assessed/performed Overall Cognitive Status: Within Functional Limits for tasks assessed                                 General Comments: Pt A&Ox4.  Requires increased processing time        Exercises Exercises: Other exercises Other Exercises Other Exercises: Pt educated re: AE recs, d/c recs, falls prevention,pain mgmt, proper brace fitting, HEP Other Exercises: LBD, toileting, grooming, sup>sit, sit<>stand x4, sitting/standing balace/tolerance    Pertinent Vitals/ Pain       Pain Assessment: 0-10 Pain Score: 9  Pain Location: L knee/hip Pain Descriptors / Indicators: Grimacing;Guarding Pain Intervention(s): Limited activity within patient's tolerance;Patient requesting pain meds-RN notified   Frequency  Min 2X/week        Progress Toward Goals  OT Goals(current goals can now be found in the care  plan section)  Progress towards OT goals: Progressing toward goals  Acute Rehab OT Goals Patient Stated Goal: To be able to get out of room and progress ambulation OT Goal Formulation: With patient Time For Goal Achievement: 09/20/20 Potential to Achieve Goals: Good ADL Goals Pt Will Perform Grooming: with modified independence;sitting Pt Will Perform Lower Body Dressing: with modified independence;sitting/lateral leans Pt Will Transfer to Toilet: with modified independence;stand pivot transfer;bedside commode  Plan Discharge plan remains appropriate;Frequency remains appropriate    Co-evaluation                 AM-PAC OT "6 Clicks" Daily Activity     Outcome Measure   Help from another person eating meals?: None Help from another person taking care of personal grooming?: A Little Help from another person toileting, which includes using toliet, bedpan, or urinal?: A Little Help from another person bathing (including washing, rinsing, drying)?: A Lot Help from another person to put on and taking off regular upper body clothing?: A Little Help from another person to put on and taking off regular lower body clothing?: A Lot 6 Click Score: 17    End of Session Equipment Utilized During Treatment: Rolling walker  OT Visit Diagnosis: Other abnormalities of gait and mobility (R26.89);Muscle weakness (generalized) (M62.81)   Activity Tolerance Patient tolerated treatment well   Patient Left in chair;with call bell/phone within reach;with chair alarm set   Nurse Communication Mobility status        Time: 1610-9604 OT Time Calculation (min): 40 min  Charges: OT General Charges $OT Visit: 1 Visit OT Treatments $Self Care/Home Management : 38-52 mins  Kathie Dike, M.S. OTR/L  09/15/20, 10:09 AM  ascom [phone]

## 2020-09-15 NOTE — Progress Notes (Signed)
Patient ID: Nicole Logan, female   DOB: Jun 09, 1970, 50 y.o.   MRN: [id number]  PROGRESS NOTE    Nicole Logan  GLO:[id number] DOB: 1970/07/27 DOA: 09/05/2020 PCP: Cory Roughen, PA-C   Brief Narrative:  50 year old female with history of morbid obesity, hypertension, complex regional pain syndrome type I, HIV, hypertension presented with fall resulting in pain in left hip, left knee and lower back.  She was found to have left chronic ACL tear, left knee MCL sprain and left nondisplaced lateral meniscal tear.  Orthopedics recommended conservative management.  PT recommended SNF placement.  She is currently stable for discharge to SNF.  Assessment & Plan:   Left chronic ACL tear Left knee MCL sprain Left nondisplaced lateral meniscus tear Acute popliteus and gastrocnemius muscle strains -As per MRI of left knee. -Orthopedics recommended conservative management.  Second opinion was requested as per patient's request and Dr. Allena Katz agreed with Dr. Samuel Germany assessment -Per Ortho: may remove the brace to work on bedside knee range of motion exercises but should wear the brace if she is standing or ambulating.  Follow-up in Ortho clinic in 2 to 3 weeks after discharge.  Left hip pain/back pain Sciatica -CT of lumbar spine and left hip was without any acute abnormality, did show worsening of degenerative disease.  She also has lumbar spinal stenosis and degenerative disc disease. -Continue home opioids as needed.  Continue Flexeril as needed along with Lyrica  Fall at home  -PT OT recommend SNF placement.  Social worker following.  Hypertension -Blood pressure stable.  Off antihypertensives.  Chronic regional pain syndrome type I on chronic opioids -Continue home opiates but as as needed.  Continue bowel regimen.  HIV -Continue Biktarvy.  Outpatient follow-up with ID  Depression with anxiety -Continue home Xanax as needed   DVT prophylaxis: Lovenox Code Status:  Full Family Communication: None at bedside Disposition Plan: Status is: Inpatient  Remains inpatient appropriate because:Inpatient level of care appropriate due to severity of illness  Dispo: The patient is from: Home              Anticipated d/c is to: SNF              Patient currently is medically stable to d/c.   Difficult to place patient Yes   Consultants: Orthopedics  Procedures: None  Antimicrobials: None   Subjective: Patient seen and examined at bedside.  Still complains of intermittent knee pain.  No worsening shortness of breath, fever or vomiting reported.  Objective: Vitals:   09/14/20 2035 09/14/20 2351 09/15/20 0412 09/15/20 0739  BP: (!) 124/94 105/61 118/60 116/68  Pulse: 100 98 94 96  Resp: 17 15 16 16   Temp: 98.3 F (36.8 C) 98.9 F (37.2 C) 98.1 F (36.7 C) 98.3 F (36.8 C)  TempSrc: Oral  Oral   SpO2: 97% 92% 93% 94%  Weight:      Height:        Intake/Output Summary (Last 24 hours) at 09/15/2020 0746 Last data filed at 09/14/2020 1257 Gross per 24 hour  Intake 480 ml  Output 1200 ml  Net -720 ml    Filed Weights   09/04/20 1850  Weight: (!) 158.8 kg    Examination:  General exam: No distress.  On room air currently.  Looks chronically ill.  Poor historian. Respiratory system: Decreased breath sounds at bases bilaterally with some scattered crackles Cardiovascular system: Rate controlled, S1-S2 heard gastrointestinal system: Abdomen is morbidly obese, distended, soft and nontender.  Bowel sounds are heard Extremities: Bilateral lower extremity edema present; no cyanosis.  Left knee in brace.   Data Reviewed: I have personally reviewed following labs and imaging studies  CBC: Recent Labs  Lab 09/13/20 0540  WBC 6.1  HGB 10.8*  HCT 35.5*  MCV 86.0  PLT 296    Basic Metabolic Panel: No results for input(s): NA, K, CL, CO2, GLUCOSE, BUN, CREATININE, CALCIUM, MG, PHOS in the last 168 hours. GFR: Estimated Creatinine Clearance:  119 mL/min (by C-G formula based on SCr of 0.85 mg/dL). Liver Function Tests: No results for input(s): AST, ALT, ALKPHOS, BILITOT, PROT, ALBUMIN in the last 168 hours. No results for input(s): LIPASE, AMYLASE in the last 168 hours. No results for input(s): AMMONIA in the last 168 hours. Coagulation Profile: No results for input(s): INR, PROTIME in the last 168 hours. Cardiac Enzymes: No results for input(s): CKTOTAL, CKMB, CKMBINDEX, TROPONINI in the last 168 hours. BNP (last 3 results) No results for input(s): PROBNP in the last 8760 hours. HbA1C: No results for input(s): HGBA1C in the last 72 hours. CBG: No results for input(s): GLUCAP in the last 168 hours. Lipid Profile: No results for input(s): CHOL, HDL, LDLCALC, TRIG, CHOLHDL, LDLDIRECT in the last 72 hours. Thyroid Function Tests: No results for input(s): TSH, T4TOTAL, FREET4, T3FREE, THYROIDAB in the last 72 hours. Anemia Panel: No results for input(s): VITAMINB12, FOLATE, FERRITIN, TIBC, IRON, RETICCTPCT in the last 72 hours. Sepsis Labs: No results for input(s): PROCALCITON, LATICACIDVEN in the last 168 hours.  Recent Results (from the past 240 hour(s))  Resp Panel by RT-PCR (Flu A&B, Covid) Nasopharyngeal Swab     Status: None   Collection Time: 09/05/20  8:13 PM   Specimen: Nasopharyngeal Swab; Nasopharyngeal(NP) swabs in vial transport medium  Result Value Ref Range Status   SARS Coronavirus 2 by RT PCR NEGATIVE NEGATIVE Final    Comment: (NOTE) SARS-CoV-2 target nucleic acids are NOT DETECTED.  The SARS-CoV-2 RNA is generally detectable in upper respiratory specimens during the acute phase of infection. The lowest concentration of SARS-CoV-2 viral copies this assay can detect is 138 copies/mL. A negative result does not preclude SARS-Cov-2 infection and should not be used as the sole basis for treatment or other patient management decisions. A negative result may occur with  improper specimen collection/handling,  submission of specimen other than nasopharyngeal swab, presence of viral mutation(s) within the areas targeted by this assay, and inadequate number of viral copies(<138 copies/mL). A negative result must be combined with clinical observations, patient history, and epidemiological information. The expected result is Negative.  Fact Sheet for Patients:  BloggerCourse.com  Fact Sheet for Healthcare Providers:  SeriousBroker.it  This test is no t yet approved or cleared by the [place] FDA and  has been authorized for detection and/or diagnosis of SARS-CoV-2 by FDA under an Emergency Use Authorization (EUA). This EUA will remain  in effect (meaning this test can be used) for the duration of the COVID-19 declaration under Section 564(b)(1) of the Act, 21 U.S.C.section 360bbb-3(b)(1), unless the authorization is terminated  or revoked sooner.       Influenza A by PCR NEGATIVE NEGATIVE Final   Influenza B by PCR NEGATIVE NEGATIVE Final    Comment: (NOTE) The Xpert Xpress SARS-CoV-2/FLU/RSV plus assay is intended as an aid in the diagnosis of influenza from Nasopharyngeal swab specimens and should not be used as a sole basis for treatment. Nasal washings and aspirates are unacceptable for Xpert Xpress SARS-CoV-2/FLU/RSV testing.  Fact Sheet for Patients: BloggerCourse.com  Fact Sheet for Healthcare Providers: SeriousBroker.it  This test is not yet approved or cleared by the [place] FDA and has been authorized for detection and/or diagnosis of SARS-CoV-2 by FDA under an Emergency Use Authorization (EUA). This EUA will remain in effect (meaning this test can be used) for the duration of the COVID-19 declaration under Section 564(b)(1) of the Act, 21 U.S.C. section 360bbb-3(b)(1), unless the authorization is terminated or revoked.  Performed at [facility], [address], [place], [place] [address]           Radiology Studies: No results found.      Scheduled Meds:  allopurinol  300 mg Oral Daily   amphetamine-dextroamphetamine  30 mg Oral BID   bictegravir-emtricitabine-tenofovir AF  1 tablet Oral QHS   enoxaparin (LOVENOX) injection  0.5 mg/kg Subcutaneous Q24H   lurasidone  120 mg Oral QHS   multivitamin with minerals  1 tablet Oral Q1200   naloxegol oxalate  25 mg Oral Daily   pantoprazole  40 mg Oral BID   polyethylene glycol  17 g Oral BID   pregabalin  75 mg Oral TID   ziprasidone  40 mg Oral BID WC   Continuous Infusions:        Glade Lloyd, MD [facility] 09/15/2020, 7:46 AM

## 2020-09-15 NOTE — TOC Progression Note (Signed)
Transition of Care [facility]) - Progression Note    Patient Details  Name: Nicole Logan MRN: [id number] Date of Birth: 01-06-1971  [facility] CM/SW Contact  Caryn Section, RN Phone Number: 09/15/2020, 4:20 PM  Clinical Narrative:   Patient accepted bed offer at [facility].  [facility] is now accepting patients as per [facility].  Auth in progress.  Patient requested chaplain, care team aware.         Expected Discharge Plan and Services           Expected Discharge Date: 09/06/20                                     Social Determinants of Health (SDOH) Interventions    Readmission Risk Interventions No flowsheet data found.

## 2020-09-15 NOTE — Progress Notes (Signed)
   09/15/20 1624  Clinical Encounter Type  Visited With Patient  Visit Type Initial;Spiritual support;Social support  Referral From Nurse  Consult/Referral To Chaplain  Spiritual Encounters  Spiritual Needs Prayer;Emotional;Sacred text;Grief support  Stress Factors  Patient Stress Factors Loss of control;Exhausted;Financial concerns;Health changes   Chaplain responded to page from nurse, stating PT could use a visit. Chaplain established initial pastoral care. PT was able to express her emotions and concerns. PT is concerned about health challenges because of her leg, she is very anxious about the rehab center that she will be discharged to, and she is very worried about her finances. PT was able to talk about her place of origin and family dynamics. Chaplain normalized her emotions and explored coping mechanisms. Chaplain ministered with reflective listing, compassionate presence, and prayer.  Chaplain will get another chaplain to follow up with her tomorrow.  Posey Boyer, M. Div.

## 2020-09-15 NOTE — Progress Notes (Signed)
PT Cancellation Note  Patient Details Name: SUA SPADAFORA MRN: [id number] DOB: 05-26-1970   Cancelled Treatment:    Reason Eval/Treat Not Completed: Other (comment) Pt declined due to financial and housing stress and reporting being "too worked up" to work with therapy. Attempted to offer other options for therapy and pt continued to decline desire for services today.  Verl Doyle, SPT   Verl Stencel 09/15/2020, 3:33 PM

## 2020-09-16 DIAGNOSIS — M25552 Pain in left hip: Secondary | ICD-10-CM | POA: Diagnosis not present

## 2020-09-16 DIAGNOSIS — N179 Acute kidney failure, unspecified: Secondary | ICD-10-CM | POA: Diagnosis not present

## 2020-09-16 DIAGNOSIS — G9059 Complex regional pain syndrome I of other specified site: Secondary | ICD-10-CM | POA: Diagnosis not present

## 2020-09-16 LAB — RESP PANEL BY RT-PCR (FLU A&B, COVID) ARPGX2
Influenza A by PCR: NEGATIVE
Influenza B by PCR: NEGATIVE
SARS Coronavirus 2 by RT PCR: NEGATIVE

## 2020-09-16 MED ORDER — POLYETHYLENE GLYCOL 3350 17 G PO PACK: 17.0000 g | PACK | Freq: Two times a day (BID) | ORAL | 0 refills | Status: AC

## 2020-09-16 MED ORDER — CYCLOBENZAPRINE HCL 5 MG PO TABS: 5.0000 mg | ORAL_TABLET | Freq: Three times a day (TID) | ORAL | Status: AC | PRN

## 2020-09-16 MED ORDER — OXYCODONE-ACETAMINOPHEN 7.5-325 MG PO TABS: 1.0000 | ORAL_TABLET | Freq: Four times a day (QID) | ORAL | 0 refills | Status: AC | PRN

## 2020-09-16 MED ORDER — ALPRAZOLAM 1 MG PO TABS: 1.0000 mg | ORAL_TABLET | Freq: Three times a day (TID) | ORAL | 0 refills | Status: AC | PRN

## 2020-09-16 MED ORDER — METHYLNALTREXONE BROMIDE 150 MG PO TABS: 450.0000 mg | ORAL_TABLET | Freq: Every day | ORAL | 0 refills | Status: AC

## 2020-09-16 MED ORDER — MORPHINE SULFATE ER 15 MG PO TBCR: 15.0000 mg | EXTENDED_RELEASE_TABLET | Freq: Three times a day (TID) | ORAL | 0 refills | Status: AC | PRN

## 2020-09-16 MED ORDER — PREGABALIN 75 MG PO CAPS: 75.0000 mg | ORAL_CAPSULE | Freq: Three times a day (TID) | ORAL | 0 refills | Status: AC

## 2020-09-16 NOTE — Plan of Care (Signed)
No acute events this shift. °Problem: Education: °Goal: Knowledge of General Education information will improve °Description: Including pain rating scale, medication(s)/side effects and non-pharmacologic comfort measures °Outcome: Progressing °  °Problem: Health Behavior/Discharge Planning: °Goal: Ability to manage health-related needs will improve °Outcome: Progressing °  °Problem: Clinical Measurements: °Goal: Ability to maintain clinical measurements within normal limits will improve °Outcome: Progressing °Goal: Will remain free from infection °Outcome: Progressing °Goal: Diagnostic test results will improve °Outcome: Progressing °Goal: Respiratory complications will improve °Outcome: Progressing °Goal: Cardiovascular complication will be avoided °Outcome: Progressing °  °Problem: Activity: °Goal: Risk for activity intolerance will decrease °Outcome: Progressing °  °Problem: Nutrition: °Goal: Adequate nutrition will be maintained °Outcome: Progressing °  °Problem: Coping: °Goal: Level of anxiety will decrease °Outcome: Progressing °  °Problem: Elimination: °Goal: Will not experience complications related to bowel motility °Outcome: Progressing °Goal: Will not experience complications related to urinary retention °Outcome: Progressing °  °Problem: Pain Managment: °Goal: General experience of comfort will improve °Outcome: Progressing °  °Problem: Safety: °Goal: Ability to remain free from injury will improve °Outcome: Progressing °  °Problem: Skin Integrity: °Goal: Risk for impaired skin integrity will decrease °Outcome: Progressing °  °

## 2020-09-16 NOTE — Care Management Important Message (Signed)
Important Message  Patient Details  Name: Nicole Logan MRN: [id number] Date of Birth: 27-Dec-1970   Medicare Important Message Given:  Yes     Olegario Messier A Mathilde Mcwherter 09/16/2020, 11:12 AM

## 2020-09-16 NOTE — Progress Notes (Signed)
Blood pressure 125/83, pulse (!) 103, temperature 98.2 F (36.8 C), temperature source Oral, resp. rate 20, height 5\' 4"  (1.626 m), weight (!) 158.8 kg, SpO2 97 %. IV cath removed site c/d/I, pt transported via EMS by stretcher with d/c packet, hard scripts and all of the belongings. Pt going to [facility]. Attempted to call report unsuccessful both times.

## 2020-09-16 NOTE — Progress Notes (Signed)
Physical Therapy Treatment Patient Details Name: Nicole Logan MRN: [id number] DOB: 07/11/1970 Today's Date: 09/16/2020    History of Present Illness 50 y.o. female with PMH of HIV, hypertension, type 2 diabetes, NASH, peripheral neuropathy, IBS, migraines, hypothyroidism, hypercholesterolemia, hepatitis, and ADHD who presents with left knee/hip and leg pain, acute onset when the patient was standing and felt/heard a pop in her left knee.She could not bear weight and collapsed to the ground. 09/04/20 L knee imaging: No evidence of fracture, dislocation, or joint effusion. MRI 8/31 reveals acute and chronic ligimentus, meniscal and muscular strains/tears.    PT Comments    Pt presents in bathroom upon arrival with EVS present and requesting assistance. The pt demonstrates ability to perform sit<>stand transfer x2 with supervision with RW. She is able to ambulate in room with supervision with use of RW. The pt is able to verbalize how to adjust her Left KI, however is dependent for adjustment. At this time current plan remains appropriate.     Follow Up Recommendations  SNF;Supervision/Assistance - 24 hour     Equipment Recommendations  Rolling walker with 5" wheels;3in1 (PT)    Recommendations for Other Services       Precautions / Restrictions Precautions Precautions: Fall Required Braces or Orthoses: Knee Immobilizer - Left Knee Immobilizer - Left: On when out of bed or walking Restrictions Weight Bearing Restrictions: Yes LLE Weight Bearing: Weight bearing as tolerated Other Position/Activity Restrictions: knee immobilizer locked in extension during WBing/ambulation    Mobility  Bed Mobility                    Transfers Overall transfer level: Needs assistance Equipment used: Rolling walker (2 wheeled) Transfers: Sit to/from Stand Sit to Stand: Supervision            Ambulation/Gait Ambulation/Gait assistance: Supervision;Min guard Gait Distance (Feet):  15 Feet Assistive device: Rolling walker (2 wheeled) Gait Pattern/deviations: Step-to pattern;Decreased stride length Gait velocity: decreased       Stairs             Wheelchair Mobility    Modified Rankin (Stroke Patients Only)       Balance Overall balance assessment: Needs assistance;History of Falls                                          Cognition Arousal/Alertness: Awake/alert Behavior During Therapy: WFL for tasks assessed/performed Overall Cognitive Status: Within Functional Limits for tasks assessed                                 General Comments: Pt A&Ox4. Requires increased processing time      Exercises Other Exercises Other Exercises: Pt able to repeat back needs for KI adjustment. Dependent for adjustment. Other Exercises: Dependent for hygiene following toileting    General Comments        Pertinent Vitals/Pain Pain Assessment: No/denies pain    Home Living                      Prior Function            PT Goals (current goals can now be found in the care plan section) Acute Rehab PT Goals Patient Stated Goal: To be able to get out of room and progress ambulation PT Goal Formulation:  With patient Time For Goal Achievement: 09/20/20 Potential to Achieve Goals: Good Progress towards PT goals: Progressing toward goals    Frequency    Min 2X/week      PT Plan Current plan remains appropriate    Co-evaluation              AM-PAC PT "6 Clicks" Mobility   Outcome Measure  Help needed turning from your back to your side while in a flat bed without using bedrails?: A Little Help needed moving from lying on your back to sitting on the side of a flat bed without using bedrails?: A Little Help needed moving to and from a bed to a chair (including a wheelchair)?: A Little Help needed standing up from a chair using your arms (e.g., wheelchair or bedside chair)?: A Little Help needed to  walk in hospital room?: A Little Help needed climbing 3-5 steps with a railing? : A Lot 6 Click Score: 17    End of Session Equipment Utilized During Treatment: Left knee immobilizer Activity Tolerance: Patient limited by fatigue Patient left: in chair;with call bell/phone within reach   PT Visit Diagnosis: Difficulty in walking, not elsewhere classified (R26.2);Pain;Muscle weakness (generalized) (M62.81);Unsteadiness on feet (R26.81) Pain - Right/Left: Left Pain - part of body: Knee;Hip     Time: 6213-0865 PT Time Calculation (min) (ACUTE ONLY): 26 min  Charges:  $Gait Training: 8-22 mins $Therapeutic Activity: 8-22 mins                     10:16 AM, 09/16/20 [name] PT, DPT Physical Therapist - [facility]    [name] [name] 09/16/2020, 10:15 AM

## 2020-09-16 NOTE — Discharge Summary (Signed)
Physician Discharge Summary  Nicole Logan RXV:[id number] DOB: [birth year] DOA: 09/05/2020  PCP: Cory Roughen, PA-C  Admit date: 09/05/2020 Discharge date: 09/16/2020  Admitted From: Home Disposition: SNF  Recommendations for Outpatient Follow-up:  Follow up with SNF provider at earliest convenience Outpatient followup with Orthopedics. Activity as per orthopedics and PT recommendations Follow up in ED if symptoms worsen or new appear   Home Health: No Equipment/Devices: None  Discharge Condition: Stable CODE STATUS: Full Diet recommendation: Heart healthy  Brief/Interim Summary: 50 year old female with history of morbid obesity, hypertension, complex regional pain syndrome type I, HIV, hypertension presented with fall resulting in pain in left hip, left knee and lower back.  She was found to have left chronic ACL tear, left knee MCL sprain and left nondisplaced lateral meniscal tear.  Orthopedics recommended conservative management.  PT recommended SNF placement.  She will be discharged to SNF once bed is available.  Discharge Diagnoses:   Left chronic ACL tear Left knee MCL sprain Left nondisplaced lateral meniscus tear Acute popliteus and gastrocnemius muscle strains -As per MRI of left knee. -Orthopedics recommended conservative management.  Second opinion was requested as per patient's request and Dr. Allena Katz agreed with Dr. Samuel Germany assessment -Per Ortho: may remove the brace to work on bedside knee range of motion exercises but should wear the brace if she is standing or ambulating.  Follow-up in Ortho clinic in 2 to 3 weeks after discharge.   Left hip pain/back pain Sciatica -CT of lumbar spine and left hip was without any acute abnormality, did show worsening of degenerative disease.  She also has lumbar spinal stenosis and degenerative disc disease. -Continue home opioids as needed.  Continue Flexeril as needed along with Lyrica   Fall at home  -PT OT  recommend SNF placement.  She will be discharged to SNF once bed is available.   Hypertension -Blood pressure stable.  Off antihypertensives.   Chronic regional pain syndrome type I on chronic opioids -Continue home opiates but as as needed.  Continue bowel regimen.   HIV -Continue Biktarvy.  Outpatient follow-up with ID   Depression with anxiety -Continue home Xanax as needed    Discharge Instructions  Discharge Instructions     Diet - low sodium heart healthy   Complete by: As directed    Diet - low sodium heart healthy   Complete by: As directed    Increase activity slowly   Complete by: As directed    Increase activity slowly   Complete by: As directed       Allergies as of 09/16/2020       Reactions   Latex Hives, Itching, Rash, Swelling   Atenolol Other (See Comments)   Diatrizoate Itching   Lamotrigine Other (See Comments)   Chest discomfort/ anxiety attack Chest discomfort/ anxiety attack   Lisinopril Other (See Comments)   Headaches, sick on stomach   Morphine And Related Other (See Comments)   "severe migraine"   Tramadol Other (See Comments)   "severe migraine"   Iodinated Diagnostic Agents Other (See Comments), Rash   HIves, "I feel like I'm on fire"        Medication List     STOP taking these medications    furosemide 20 MG tablet Commonly known as: LASIX   olmesartan 20 MG tablet Commonly known as: BENICAR       TAKE these medications    albuterol 108 (90 Base) MCG/ACT inhaler Commonly known as: VENTOLIN HFA Inhale 2 puffs into  the lungs every 6 (six) hours as needed for wheezing or shortness of breath.   allopurinol 300 MG tablet Commonly known as: ZYLOPRIM Take 300 mg by mouth daily.   ALPRAZolam 1 MG tablet Commonly known as: XANAX Take 1 tablet (1 mg total) by mouth 3 (three) times daily as needed for anxiety.   amphetamine-dextroamphetamine 30 MG 24 hr capsule Commonly known as: ADDERALL XR Take 30 mg by mouth 2 (two)  times daily.   Biktarvy 50-200-25 MG Tabs tablet Generic drug: bictegravir-emtricitabine-tenofovir AF Take 1 tablet by mouth at bedtime.   Centrum Silver 50+Women Tabs Take 1 tablet by mouth daily before breakfast.   clotrimazole-betamethasone cream Commonly known as: LOTRISONE Apply 1 application topically 2 (two) times daily as needed (skin irritation).   cyclobenzaprine 5 MG tablet Commonly known as: FLEXERIL Take 1 tablet (5 mg total) by mouth 3 (three) times daily as needed for muscle spasms. What changed:  when to take this reasons to take this   eletriptan 20 MG tablet Commonly known as: RELPAX Take 20 mg by mouth as needed for migraine or headache. May repeat in 2 hours if headache persists or recurs.   Lurasidone HCl 120 MG Tabs Take 120 mg by mouth at bedtime.   Methylnaltrexone Bromide 150 MG Tabs Take 450 mg by mouth daily.   morphine 15 MG 12 hr tablet Commonly known as: MS CONTIN Take 1 tablet (15 mg total) by mouth 3 (three) times daily as needed (moderate pain). What changed:  when to take this reasons to take this   olopatadine 0.1 % ophthalmic solution Commonly known as: PATANOL Place 1 drop into both eyes 2 (two) times daily as needed for allergies.   omeprazole 20 MG capsule Commonly known as: PRILOSEC Take 20 mg by mouth 2 (two) times daily.   oxyCODONE-acetaminophen 7.5-325 MG tablet Commonly known as: PERCOCET Take 1 tablet by mouth every 6 (six) hours as needed for moderate pain or severe pain.   polyethylene glycol 17 g packet Commonly known as: MIRALAX / GLYCOLAX Take 17 g by mouth 2 (two) times daily.   pregabalin 75 MG capsule Commonly known as: LYRICA Take 1 capsule (75 mg total) by mouth 3 (three) times daily. What changed:  medication strength how much to take when to take this   zinc sulfate 220 (50 Zn) MG capsule Take 220 mg by mouth daily.   ziprasidone 40 MG capsule Commonly known as: GEODON Take 40 mg by mouth 2  (two) times daily with a meal.               Durable Medical Equipment  (From admission, onward)           Start     Ordered   09/06/20 1003  For home use only DME Walker rolling  Once       Comments: Bariatric one  Question Answer Comment  Walker: With 5 Inch Wheels   Patient needs a walker to treat with the following condition Hip pain      09/06/20 1005            Follow-up Information     Cory Roughen, PA-C. Go to.   Specialty: Internal Medicine Why: Sept 6th check in 2:05pm and appointment at 2:20 Contact information: [address] [place]. [place] [address] [phone]         Juanell Fairly, MD. Schedule an appointment as soon as possible for a visit in 1 week(s).  Specialty: Orthopedic Surgery Contact information: [address] [place] [id number] [phone]                Allergies  Allergen Reactions   Latex Hives, Itching, Rash and Swelling   Atenolol Other (See Comments)   Diatrizoate Itching   Lamotrigine Other (See Comments)    Chest discomfort/ anxiety attack Chest discomfort/ anxiety attack    Lisinopril Other (See Comments)    Headaches, sick on stomach   Morphine And Related Other (See Comments)    "severe migraine"   Tramadol Other (See Comments)    "severe migraine"   Iodinated Diagnostic Agents Other (See Comments) and Rash    HIves, "I feel like I'm on fire"    Consultations: Orthopedics   Procedures/Studies: CT Lumbar Spine Wo Contrast  Result Date: 09/05/2020 CLINICAL DATA:  50 year old female with acute onset low back pain, left hip and knee pain. "Felt a pop" . EXAM: CT LUMBAR SPINE WITHOUT CONTRAST TECHNIQUE: Multidetector CT imaging of the lumbar spine was performed without intravenous contrast administration. Multiplanar CT image reconstructions were also generated. COMPARISON:  CT Abdomen and Pelvis 10/10/2010. FINDINGS: Segmentation: Normal. Alignment: Mild  straightening of lumbar lordosis. Subtle retrolisthesis of L5 on S1. Vertebrae: No acute osseous abnormality identified. Bone mineralization is within normal limits. Mild lower thoracic endplate spurring. Mild chronic appearing irregularity of the superior L3 and L4 endplates, likely Schmorl's nodes. Intact visible sacrum and SI joints. Paraspinal and other soft tissues: Chronic low-density benign left adrenal adenoma(s) have not significantly changed since 2012 on series 5, image 3. Negative visible right adrenal gland, spleen, pancreas, stomach, small bowel, bladder and uterus. Possible hepatic steatosis. Punctate left nephrolithiasis. No hydronephrosis. Diverticulosis in the descending and sigmoid colon. Normal caliber abdominal aorta. No lymphadenopathy. Lumbar paraspinal soft tissues are within normal limits. Disc levels: T11-T12: Mild vacuum disc. Anterior endplate spurring. Otherwise negative. T12-L1:  Anterior endplate spurring, otherwise negative. L1-L2:  Negative. L2-L3: Mild circumferential disc bulge. Mild facet hypertrophy. No stenosis. L3-L4: Mild circumferential disc bulge. Moderate facet hypertrophy. Trace vacuum facet on the right. But no convincing stenosis. L4-L5: Circumferential disc bulge. Moderate to severe facet hypertrophy with vacuum facet greater on the right. Mild spinal and bilateral L4 neural foraminal stenosis. L5-S1: Subtle retrolisthesis with mild circumferential disc bulge. Moderate facet hypertrophy. No convincing spinal stenosis. Mild to moderate bilateral L5 foraminal stenosis greater on the left. IMPRESSION: 1. No acute osseous abnormality in the lumbar spine. 2. Lumbar spine degeneration maximal at L4-L5 and L5-S1 with advanced facet degeneration. Mild spinal stenosis suspected at L4-L5. Up to moderate L5 neural foraminal stenosis. 3. Benign left adrenal adenoma. Punctate left nephrolithiasis. Diverticulosis of the descending and sigmoid colon. Electronically Signed   By: Odessa FlemingH   Hall M.D.   On: 09/05/2020 05:03   CT Hip Left Wo Contrast  Result Date: 09/05/2020 CLINICAL DATA:  50 year old female with acute onset low back pain, left hip and knee pain. "Felt a pop" . EXAM: CT OF THE LEFT HIP WITHOUT CONTRAST TECHNIQUE: Multidetector CT imaging of the left hip was performed according to the standard protocol. Multiplanar CT image reconstructions were also generated. COMPARISON:  Lumbar spine CT today reported separately. CT Abdomen and Pelvis 10/10/2010. FINDINGS: Diverticulosis of the sigmoid colon. Partially visible retained stool in the rectum and mildly distended urinary bladder. Incidental pelvic phleboliths. No left inguinal or pelvic sidewall lymphadenopathy. Left femoral head is normally located. Visible left hemipelvis and proximal left femur are intact. There is left hip joint  space loss with femoral head osteophytosis, and anterior lip acetabular subchondral sclerosis and mild fragmentation. These changes are new or progressed since 2012. No regional soft tissue inflammation is evident. No acute osseous abnormality identified. IMPRESSION: Moderate left hip joint degeneration has progressed since 2012. No acute osseous abnormality identified. Electronically Signed   By: Odessa Fleming M.D.   On: 09/05/2020 05:06   MR KNEE LEFT WO CONTRAST  Result Date: 09/07/2020 CLINICAL DATA:  Knee pain, chronic, degenerative disease on xray (Age >= 5y) EXAM: MRI OF THE LEFT KNEE WITHOUT CONTRAST TECHNIQUE: Multiplanar, multisequence MR imaging of the knee was performed. No intravenous contrast was administered. COMPARISON:  Knee radiograph 09/05/2018 FINDINGS: MENISCI Medial: Intact. Lateral: There is a horizontal tear of the posterior horn and body of the lateral meniscus, nondisplaced. LIGAMENTS Cruciates: There is a midsubstance tear of the ACL. The PCL is intact. Collaterals: Periligamentous edema along the medial collateral ligament, with internal signal proximally. The popliteus tendon and  lateral collateral ligament appear intact. The biceps femoris appears intact. CARTILAGE Patellofemoral:  Mild chondrosis. Medial:  Moderate chondrosis. Lateral:  Mild chondrosis. JOINT: Large joint effusion. POPLITEAL FOSSA: No significant Baker's cyst. EXTENSOR MECHANISM: Intact quadriceps tendon. Intact patellar tendon. BONES: There is minimal bone edema in the posteromedial and lateral tibial plateaus. Other: There is significant intramuscular edema within the popliteus muscle, with intact tendon. There also feathery edema within the medial and lateral gastrocnemius muscles proximally, to a lesser degree. IMPRESSION: Midsubstance tear of the ACL. Nondisplaced horizontal tear of the posterior horn and body of the lateral meniscus. Grade 2 medial collateral ligament sprain. Grade 1/2 muscle strain of the popliteus and grade 1 muscle strain of the proximal medial and lateral gastrocnemius muscles. Large joint effusion. Electronically Signed   By: Caprice Renshaw M.D.   On: 09/07/2020 08:12   DG Knee Complete 4 Views Left  Result Date: 09/04/2020 CLINICAL DATA:  Pain after injury. EXAM: LEFT KNEE - COMPLETE 4+ VIEW COMPARISON:  None. FINDINGS: No evidence of fracture, dislocation, or joint effusion. No evidence of arthropathy or other focal bone abnormality. Soft tissues are unremarkable. IMPRESSION: Negative. Electronically Signed   By: Gerome Sam III M.D.   On: 09/04/2020 19:59   DG Hip Unilat With Pelvis 2-3 Views Left  Result Date: 09/04/2020 CLINICAL DATA:  Pain after injury. EXAM: DG HIP (WITH OR WITHOUT PELVIS) 2-3V LEFT COMPARISON:  None. FINDINGS: Mild degenerative changes in the hip with mild loss of superior joint space. No fracture or dislocation. No other acute abnormalities. IMPRESSION: Mild degenerative changes in the left hip with mild loss of superior joint space. No other acute abnormalities. Electronically Signed   By: Gerome Sam III M.D.   On: 09/04/2020 20:01       Subjective: Patient seen and examined at bedside.  No worsening shortness of breath, fever or vomiting reported.    Discharge Exam: Vitals:   09/16/20 0313 09/16/20 0726  BP: 111/65 113/71  Pulse: 96 92  Resp: 19 20  Temp: 98.1 F (36.7 C) 98.9 F (37.2 C)  SpO2: 92% 94%    General exam: Currently on room air.  No acute distress.  Looks chronically ill.  Poor historian. Respiratory system: Bilateral decreased breath sounds at bases cardiovascular system: S1-S2 heard, rate controlled  gastrointestinal system: Abdomen is morbidly obese, mildly distended, soft and nontender.  Normal bowel sounds heard Extremities: No clubbing; lower extremity edema present bilaterally.  Left knee in brace.      The results  of significant diagnostics from this hospitalization (including imaging, microbiology, ancillary and laboratory) are listed below for reference.     Microbiology: No results found for this or any previous visit (from the past 240 hour(s)).   Labs: BNP (last 3 results) No results for input(s): BNP in the last 8760 hours. Basic Metabolic Panel: No results for input(s): NA, K, CL, CO2, GLUCOSE, BUN, CREATININE, CALCIUM, MG, PHOS in the last 168 hours. Liver Function Tests: No results for input(s): AST, ALT, ALKPHOS, BILITOT, PROT, ALBUMIN in the last 168 hours. No results for input(s): LIPASE, AMYLASE in the last 168 hours. No results for input(s): AMMONIA in the last 168 hours. CBC: Recent Labs  Lab 09/13/20 0540  WBC 6.1  HGB 10.8*  HCT 35.5*  MCV 86.0  PLT 296   Cardiac Enzymes: No results for input(s): CKTOTAL, CKMB, CKMBINDEX, TROPONINI in the last 168 hours. BNP: Invalid input(s): POCBNP CBG: No results for input(s): GLUCAP in the last 168 hours. D-Dimer No results for input(s): DDIMER in the last 72 hours. Hgb A1c No results for input(s): HGBA1C in the last 72 hours. Lipid Profile No results for input(s): CHOL, HDL, LDLCALC, TRIG, CHOLHDL,  LDLDIRECT in the last 72 hours. Thyroid function studies No results for input(s): TSH, T4TOTAL, T3FREE, THYROIDAB in the last 72 hours.  Invalid input(s): FREET3 Anemia work up No results for input(s): VITAMINB12, FOLATE, FERRITIN, TIBC, IRON, RETICCTPCT in the last 72 hours. Urinalysis No results found for: COLORURINE, APPEARANCEUR, LABSPEC, PHURINE, GLUCOSEU, HGBUR, BILIRUBINUR, KETONESUR, PROTEINUR, UROBILINOGEN, NITRITE, LEUKOCYTESUR Sepsis Labs Invalid input(s): PROCALCITONIN,  WBC,  LACTICIDVEN Microbiology No results found for this or any previous visit (from the past 240 hour(s)).   Time coordinating discharge: 35 minutes  SIGNED:   Glade Lloyd, MD  [facility] 09/16/2020, 10:00 AM

## 2020-09-16 NOTE — TOC Progression Note (Signed)
Transition of Care [facility]) - Progression Note    Patient Details  Name: Nicole Logan MRN: [id number] Date of Birth: 09/28/1970  [facility] CM/SW Contact  Caryn Section, RN Phone Number: 09/16/2020, 3:05 PM  Clinical Narrative:   Authorizaiton obtained by [facility] per Archie Patten.  Archie Patten also states patient can transfer today [facility] at 5pm  Patient and care team aware.         Expected Discharge Plan and Services           Expected Discharge Date: 09/16/20                                     Social Determinants of Health (SDOH) Interventions    Readmission Risk Interventions No flowsheet data found.

## 2020-09-20 ENCOUNTER — Ambulatory Visit: Admit: 2020-09-20 | Payer: MEDICARE

## 2020-09-22 ENCOUNTER — Institutional Professional Consult (permissible substitution): Admit: 2020-09-22 | Discharge: 2020-09-23 | Payer: MEDICARE

## 2020-09-22 DIAGNOSIS — E1140 Type 2 diabetes mellitus with diabetic neuropathy, unspecified: Principal | ICD-10-CM

## 2020-09-22 DIAGNOSIS — M87052 Idiopathic aseptic necrosis of left femur: Principal | ICD-10-CM

## 2020-09-22 DIAGNOSIS — M1990 Unspecified osteoarthritis, unspecified site: Principal | ICD-10-CM

## 2020-09-22 DIAGNOSIS — M87051 Idiopathic aseptic necrosis of right femur: Principal | ICD-10-CM

## 2020-09-22 DIAGNOSIS — S8990XD Unspecified injury of unspecified lower leg, subsequent encounter: Principal | ICD-10-CM

## 2020-09-22 DIAGNOSIS — F319 Bipolar disorder, unspecified: Principal | ICD-10-CM

## 2020-09-26 ENCOUNTER — Telehealth: Admit: 2020-09-26 | Discharge: 2020-09-27 | Payer: MEDICARE

## 2020-09-26 DIAGNOSIS — M87051 Idiopathic aseptic necrosis of right femur: Principal | ICD-10-CM

## 2020-09-26 DIAGNOSIS — M87052 Idiopathic aseptic necrosis of left femur: Principal | ICD-10-CM

## 2020-09-26 DIAGNOSIS — S83519D Sprain of anterior cruciate ligament of unspecified knee, subsequent encounter: Principal | ICD-10-CM

## 2020-09-26 DIAGNOSIS — R52 Pain, unspecified: Principal | ICD-10-CM

## 2020-09-26 DIAGNOSIS — B20 Human immunodeficiency virus [HIV] disease: Principal | ICD-10-CM

## 2020-09-26 DIAGNOSIS — M545 Chronic low back pain, unspecified back pain laterality, unspecified whether sciatica present: Principal | ICD-10-CM

## 2020-09-26 DIAGNOSIS — G8929 Other chronic pain: Principal | ICD-10-CM

## 2020-09-26 DIAGNOSIS — S83419D Sprain of medial collateral ligament of unspecified knee, subsequent encounter: Principal | ICD-10-CM

## 2020-09-26 DIAGNOSIS — R600 Localized edema: Principal | ICD-10-CM

## 2020-09-26 DIAGNOSIS — M1990 Unspecified osteoarthritis, unspecified site: Principal | ICD-10-CM

## 2020-09-26 DIAGNOSIS — S8990XD Unspecified injury of unspecified lower leg, subsequent encounter: Principal | ICD-10-CM

## 2020-09-26 MED ORDER — LIDOCAINE 5 % TOPICAL PATCH
MEDICATED_PATCH | Freq: Two times a day (BID) | TRANSDERMAL | 0 refills | 5.00000 days
Start: 2020-09-26 — End: 2021-09-26

## 2020-09-26 MED ORDER — ALBUTEROL SULFATE HFA 90 MCG/ACTUATION AEROSOL INHALER
4 refills | 0 days
Start: 2020-09-26 — End: ?

## 2020-09-26 MED ORDER — ALLOPURINOL 300 MG TABLET
ORAL_TABLET | Freq: Every day | ORAL | 3 refills | 90 days
Start: 2020-09-26 — End: ?

## 2020-09-26 MED ORDER — METHYLCELLULOSE (WITH SUGAR) ORAL POWDER
11 refills | 0 days
Start: 2020-09-26 — End: ?

## 2020-09-26 MED ORDER — PREGABALIN 50 MG CAPSULE
ORAL_CAPSULE | Freq: Two times a day (BID) | ORAL | 1 refills | 90 days
Start: 2020-09-26 — End: ?

## 2020-09-26 MED ORDER — OLMESARTAN 20 MG TABLET
ORAL_TABLET | Freq: Every day | ORAL | 0 refills | 90.00000 days
Start: 2020-09-26 — End: ?

## 2020-09-26 MED ORDER — CLOTRIMAZOLE-BETAMETHASONE 1 %-0.05 % TOPICAL CREAM
1 refills | 0 days
Start: 2020-09-26 — End: ?

## 2020-09-26 MED ORDER — OMEPRAZOLE 20 MG CAPSULE,DELAYED RELEASE
ORAL_CAPSULE | Freq: Every day | ORAL | 3 refills | 90 days
Start: 2020-09-26 — End: ?

## 2020-09-26 MED ORDER — BICTEGRAVIR 50 MG-EMTRICITABINE 200 MG-TENOFOVIR ALAFENAM 25 MG TABLET
ORAL_TABLET | Freq: Every day | ORAL | 11 refills | 30.00000 days
Start: 2020-09-26 — End: ?

## 2020-09-26 MED ORDER — CYCLOBENZAPRINE 5 MG TABLET
ORAL_TABLET | 1 refills | 0 days
Start: 2020-09-26 — End: ?

## 2020-09-26 MED ORDER — MOMETASONE 50 MCG/ACTUATION NASAL SPRAY
11 refills | 0 days
Start: 2020-09-26 — End: ?

## 2020-09-26 MED ORDER — MORPHINE ER 15 MG TABLET,EXTENDED RELEASE
ORAL_TABLET | Freq: Three times a day (TID) | ORAL | 0 refills | 30 days
Start: 2020-09-26 — End: ?

## 2020-09-26 MED ORDER — ONDANSETRON HCL 4 MG TABLET
ORAL_TABLET | Freq: Three times a day (TID) | ORAL | 1 refills | 30 days | PRN
Start: 2020-09-26 — End: 2021-09-26

## 2020-09-26 MED ORDER — OLOPATADINE 0.1 % EYE DROPS
3 refills | 0 days
Start: 2020-09-26 — End: ?

## 2020-09-26 MED ORDER — METHYLNALTREXONE 150 MG TABLET
ORAL_TABLET | Freq: Every day | ORAL | 3 refills | 90.00000 days
Start: 2020-09-26 — End: ?

## 2020-09-26 MED ORDER — FUROSEMIDE 20 MG TABLET
ORAL_TABLET | Freq: Every day | ORAL | 3 refills | 90.00000 days
Start: 2020-09-26 — End: ?

## 2020-09-26 MED ORDER — ELETRIPTAN 20 MG TABLET
ORAL_TABLET | 1 refills | 0 days
Start: 2020-09-26 — End: ?

## 2020-09-26 MED ORDER — OXYCODONE-ACETAMINOPHEN 7.5 MG-325 MG TABLET
ORAL_TABLET | 0 refills | 0 days
Start: 2020-09-26 — End: ?

## 2020-09-27 MED ORDER — OMEPRAZOLE 20 MG CAPSULE,DELAYED RELEASE
ORAL_CAPSULE | Freq: Every day | ORAL | 3 refills | 90.00000 days | Status: CP
Start: 2020-09-27 — End: ?

## 2020-09-27 MED ORDER — OXYCODONE-ACETAMINOPHEN 7.5 MG-325 MG TABLET
ORAL_TABLET | 0 refills | 0 days | Status: CP
Start: 2020-09-27 — End: ?

## 2020-09-27 MED ORDER — ALLOPURINOL 300 MG TABLET
ORAL_TABLET | Freq: Every day | ORAL | 3 refills | 90 days | Status: CP
Start: 2020-09-27 — End: ?

## 2020-09-27 MED ORDER — CLOTRIMAZOLE-BETAMETHASONE 1 %-0.05 % TOPICAL CREAM
1 refills | 0 days | Status: CP
Start: 2020-09-27 — End: ?

## 2020-09-27 MED ORDER — PREGABALIN 50 MG CAPSULE
ORAL_CAPSULE | Freq: Two times a day (BID) | ORAL | 1 refills | 90.00000 days | Status: CP
Start: 2020-09-27 — End: ?

## 2020-09-27 MED ORDER — ONDANSETRON HCL 4 MG TABLET
ORAL_TABLET | Freq: Three times a day (TID) | ORAL | 1 refills | 30 days | Status: CP | PRN
Start: 2020-09-27 — End: 2021-09-27

## 2020-09-27 MED ORDER — BICTEGRAVIR 50 MG-EMTRICITABINE 200 MG-TENOFOVIR ALAFENAM 25 MG TABLET
ORAL_TABLET | Freq: Every day | ORAL | 11 refills | 30 days | Status: CP
Start: 2020-09-27 — End: ?

## 2020-09-27 MED ORDER — ALBUTEROL SULFATE HFA 90 MCG/ACTUATION AEROSOL INHALER
Freq: Four times a day (QID) | RESPIRATORY_TRACT | 4 refills | 0 days | Status: CP | PRN
Start: 2020-09-27 — End: ?

## 2020-09-27 MED ORDER — METHYLNALTREXONE 150 MG TABLET
ORAL_TABLET | Freq: Every day | ORAL | 3 refills | 90 days | Status: CP
Start: 2020-09-27 — End: ?

## 2020-09-27 MED ORDER — FUROSEMIDE 20 MG TABLET
ORAL_TABLET | Freq: Every day | ORAL | 3 refills | 90 days | Status: CP
Start: 2020-09-27 — End: ?

## 2020-09-27 MED ORDER — OLMESARTAN 20 MG TABLET
ORAL_TABLET | Freq: Every day | ORAL | 0 refills | 90.00000 days | Status: CP
Start: 2020-09-27 — End: ?

## 2020-09-27 MED ORDER — LIDOCAINE 5 % TOPICAL PATCH
MEDICATED_PATCH | Freq: Two times a day (BID) | TRANSDERMAL | 0 refills | 5.00000 days | Status: CP
Start: 2020-09-27 — End: 2021-09-27

## 2020-09-27 MED ORDER — OLOPATADINE 0.1 % EYE DROPS
Freq: Two times a day (BID) | OPHTHALMIC | 6 refills | 50 days | Status: CP
Start: 2020-09-27 — End: ?

## 2020-09-27 MED ORDER — ELETRIPTAN 20 MG TABLET
ORAL_TABLET | Freq: Once | ORAL | 1 refills | 0 days | Status: CP | PRN
Start: 2020-09-27 — End: ?

## 2020-09-27 MED ORDER — CYCLOBENZAPRINE 5 MG TABLET
ORAL_TABLET | 1 refills | 0 days | Status: CP
Start: 2020-09-27 — End: ?

## 2020-09-27 MED ORDER — METHYLCELLULOSE (WITH SUGAR) ORAL POWDER
11 refills | 0 days | Status: CP
Start: 2020-09-27 — End: ?

## 2020-09-27 MED ORDER — MORPHINE ER 15 MG TABLET,EXTENDED RELEASE
ORAL_TABLET | Freq: Three times a day (TID) | ORAL | 0 refills | 30.00000 days | Status: CP
Start: 2020-09-27 — End: ?

## 2020-10-05 ENCOUNTER — Ambulatory Visit: Admit: 2020-10-05 | Discharge: 2020-10-06 | Payer: MEDICARE

## 2020-10-05 DIAGNOSIS — S8990XD Unspecified injury of unspecified lower leg, subsequent encounter: Principal | ICD-10-CM

## 2020-10-10 DIAGNOSIS — M545 Chronic low back pain, unspecified back pain laterality, unspecified whether sciatica present: Principal | ICD-10-CM

## 2020-10-10 DIAGNOSIS — G8929 Other chronic pain: Principal | ICD-10-CM

## 2020-10-10 DIAGNOSIS — R52 Pain, unspecified: Principal | ICD-10-CM

## 2020-10-10 MED ORDER — OXYCODONE-ACETAMINOPHEN 7.5 MG-325 MG TABLET
ORAL_TABLET | 0 refills | 0 days
Start: 2020-10-10 — End: ?

## 2020-10-16 MED ORDER — OXYCODONE-ACETAMINOPHEN 7.5 MG-325 MG TABLET
ORAL_TABLET | 0 refills | 0 days | Status: CP
Start: 2020-10-16 — End: ?

## 2020-10-24 ENCOUNTER — Ambulatory Visit: Admit: 2020-10-24 | Discharge: 2020-10-25 | Payer: MEDICARE

## 2020-10-24 DIAGNOSIS — R52 Pain, unspecified: Principal | ICD-10-CM

## 2020-10-24 MED ORDER — ONDANSETRON HCL 4 MG TABLET
ORAL_TABLET | 1 refills | 0 days
Start: 2020-10-24 — End: ?

## 2020-10-24 MED ORDER — CLOTRIMAZOLE-BETAMETHASONE 1 %-0.05 % TOPICAL CREAM
1 refills | 0 days
Start: 2020-10-24 — End: ?

## 2020-10-24 MED ORDER — DICLOFENAC SODIUM 75 MG TABLET,DELAYED RELEASE
ORAL_TABLET | 0 refills | 0 days | Status: CP
Start: 2020-10-24 — End: ?

## 2020-10-24 MED ORDER — MORPHINE ER 15 MG TABLET,EXTENDED RELEASE
ORAL_TABLET | 0 refills | 0 days
Start: 2020-10-24 — End: ?

## 2020-10-25 MED ORDER — CLOTRIMAZOLE-BETAMETHASONE 1 %-0.05 % TOPICAL CREAM
1 refills | 0 days | Status: CP
Start: 2020-10-25 — End: ?

## 2020-10-25 MED ORDER — ONDANSETRON HCL 4 MG TABLET
ORAL_TABLET | 1 refills | 0 days | Status: CP
Start: 2020-10-25 — End: ?

## 2020-10-25 MED ORDER — MORPHINE ER 15 MG TABLET,EXTENDED RELEASE
ORAL_TABLET | 0 refills | 0 days | Status: CP
Start: 2020-10-25 — End: ?

## 2020-10-27 MED ORDER — CYCLOBENZAPRINE 5 MG TABLET
ORAL_TABLET | 1 refills | 0 days
Start: 2020-10-27 — End: ?

## 2020-11-08 DIAGNOSIS — G8929 Other chronic pain: Principal | ICD-10-CM

## 2020-11-08 DIAGNOSIS — M545 Chronic low back pain, unspecified back pain laterality, unspecified whether sciatica present: Principal | ICD-10-CM

## 2020-11-08 DIAGNOSIS — R52 Pain, unspecified: Principal | ICD-10-CM

## 2020-11-08 MED ORDER — OXYCODONE-ACETAMINOPHEN 7.5 MG-325 MG TABLET
ORAL_TABLET | 0 refills | 0 days | Status: CP
Start: 2020-11-08 — End: ?

## 2020-11-09 MED ORDER — CYCLOBENZAPRINE 5 MG TABLET
ORAL_TABLET | 1 refills | 0 days
Start: 2020-11-09 — End: ?

## 2020-11-11 MED ORDER — CYCLOBENZAPRINE 5 MG TABLET
ORAL_TABLET | 1 refills | 0 days | Status: CP
Start: 2020-11-11 — End: ?

## 2020-11-24 DIAGNOSIS — R52 Pain, unspecified: Principal | ICD-10-CM

## 2020-11-24 DIAGNOSIS — G8929 Other chronic pain: Principal | ICD-10-CM

## 2020-11-24 DIAGNOSIS — M545 Chronic low back pain, unspecified back pain laterality, unspecified whether sciatica present: Principal | ICD-10-CM

## 2020-11-24 MED ORDER — OXYCODONE-ACETAMINOPHEN 7.5 MG-325 MG TABLET
ORAL_TABLET | 0 refills | 0 days
Start: 2020-11-24 — End: ?

## 2020-11-24 MED ORDER — MORPHINE ER 15 MG TABLET,EXTENDED RELEASE
ORAL_TABLET | 0 refills | 0 days
Start: 2020-11-24 — End: ?

## 2020-11-25 MED ORDER — DICLOFENAC SODIUM 75 MG TABLET,DELAYED RELEASE
ORAL_TABLET | 2 refills | 0 days | Status: CP
Start: 2020-11-25 — End: ?

## 2020-11-25 MED ORDER — OXYCODONE-ACETAMINOPHEN 7.5 MG-325 MG TABLET
ORAL_TABLET | 0 refills | 0 days | Status: CP
Start: 2020-11-25 — End: ?

## 2020-11-25 MED ORDER — MORPHINE ER 15 MG TABLET,EXTENDED RELEASE
ORAL_TABLET | 0 refills | 0 days | Status: CP
Start: 2020-11-25 — End: ?

## 2020-12-06 DIAGNOSIS — F32A Anxiety and depression: Principal | ICD-10-CM

## 2020-12-06 DIAGNOSIS — F419 Anxiety disorder, unspecified: Principal | ICD-10-CM

## 2020-12-06 DIAGNOSIS — F909 Attention-deficit hyperactivity disorder, unspecified type: Principal | ICD-10-CM

## 2020-12-06 MED ORDER — ALPRAZOLAM 1 MG TABLET
ORAL_TABLET | Freq: Every evening | ORAL | 0 refills | 30 days | Status: CP | PRN
Start: 2020-12-06 — End: ?

## 2020-12-06 MED ORDER — DEXTROAMPHETAMINE-AMPHETAMINE ER 30 MG 24HR CAPSULE,EXTEND RELEASE
ORAL_CAPSULE | Freq: Two times a day (BID) | ORAL | 0 refills | 30 days | Status: CP
Start: 2020-12-06 — End: ?

## 2020-12-06 MED ORDER — LATUDA 120 MG TABLET
ORAL_TABLET | Freq: Every day | ORAL | 0 refills | 30 days | Status: CP
Start: 2020-12-06 — End: ?

## 2020-12-21 ENCOUNTER — Institutional Professional Consult (permissible substitution): Admit: 2020-12-21 | Discharge: 2020-12-22 | Payer: MEDICARE

## 2020-12-21 DIAGNOSIS — R52 Pain, unspecified: Principal | ICD-10-CM

## 2020-12-21 DIAGNOSIS — G8929 Other chronic pain: Principal | ICD-10-CM

## 2020-12-21 DIAGNOSIS — M545 Chronic low back pain, unspecified back pain laterality, unspecified whether sciatica present: Principal | ICD-10-CM

## 2020-12-21 MED ORDER — OXYCODONE-ACETAMINOPHEN 7.5 MG-325 MG TABLET
ORAL_TABLET | 0 refills | 0.00000 days
Start: 2020-12-21 — End: ?

## 2020-12-21 MED ORDER — ELETRIPTAN 40 MG TABLET
ORAL_TABLET | Freq: Once | ORAL | 11 refills | 0 days | Status: CP | PRN
Start: 2020-12-21 — End: ?

## 2020-12-21 MED ORDER — MOUNJARO 2.5 MG/0.5 ML SUBCUTANEOUS PEN INJECTOR
SUBCUTANEOUS | 0 refills | 0 days | Status: CP
Start: 2020-12-21 — End: ?

## 2020-12-21 MED ORDER — OZEMPIC 0.25 MG OR 0.5 MG (2 MG/1.5 ML) SUBCUTANEOUS PEN INJECTOR
SUBCUTANEOUS | 0 refills | 56.00000 days | Status: CP
Start: 2020-12-21 — End: 2020-12-21

## 2020-12-22 MED ORDER — OXYCODONE-ACETAMINOPHEN 7.5 MG-325 MG TABLET
ORAL_TABLET | 0 refills | 0 days | Status: CP
Start: 2020-12-22 — End: ?

## 2020-12-27 DIAGNOSIS — R52 Pain, unspecified: Principal | ICD-10-CM

## 2020-12-27 MED ORDER — MORPHINE ER 15 MG TABLET,EXTENDED RELEASE
ORAL_TABLET | 0 refills | 0 days
Start: 2020-12-27 — End: ?

## 2020-12-28 DIAGNOSIS — D509 Iron deficiency anemia, unspecified: Principal | ICD-10-CM

## 2020-12-29 MED ORDER — MORPHINE ER 15 MG TABLET,EXTENDED RELEASE
ORAL_TABLET | 0 refills | 0 days | Status: CP
Start: 2020-12-29 — End: ?

## 2021-01-08 MED ORDER — ONDANSETRON HCL 4 MG TABLET
ORAL_TABLET | 1 refills | 0 days
Start: 2021-01-08 — End: ?

## 2021-01-10 MED ORDER — ONDANSETRON HCL 4 MG TABLET
ORAL_TABLET | 1 refills | 0 days
Start: 2021-01-10 — End: ?

## 2021-01-11 DIAGNOSIS — D509 Iron deficiency anemia, unspecified: Principal | ICD-10-CM

## 2021-01-16 DIAGNOSIS — G8929 Other chronic pain: Principal | ICD-10-CM

## 2021-01-16 DIAGNOSIS — R52 Pain, unspecified: Principal | ICD-10-CM

## 2021-01-16 DIAGNOSIS — M545 Chronic low back pain, unspecified back pain laterality, unspecified whether sciatica present: Principal | ICD-10-CM

## 2021-01-16 MED ORDER — OXYCODONE-ACETAMINOPHEN 7.5 MG-325 MG TABLET
ORAL_TABLET | 0 refills | 0 days | Status: CP
Start: 2021-01-16 — End: ?

## 2021-01-18 MED ORDER — LATUDA 120 MG TABLET
ORAL_TABLET | 0 refills | 0 days
Start: 2021-01-18 — End: ?

## 2021-01-18 MED ORDER — ONDANSETRON HCL 4 MG TABLET
ORAL_TABLET | 1 refills | 0 days
Start: 2021-01-18 — End: ?

## 2021-01-19 MED ORDER — LATUDA 120 MG TABLET
ORAL_TABLET | 0 refills | 0 days | Status: CP
Start: 2021-01-19 — End: ?

## 2021-01-19 MED ORDER — ONDANSETRON HCL 4 MG TABLET
ORAL_TABLET | 1 refills | 0 days | Status: CP
Start: 2021-01-19 — End: ?

## 2021-01-26 DIAGNOSIS — R52 Pain, unspecified: Principal | ICD-10-CM

## 2021-01-26 MED ORDER — MORPHINE ER 15 MG TABLET,EXTENDED RELEASE
ORAL_TABLET | Freq: Three times a day (TID) | ORAL | 0 refills | 30 days | Status: CP
Start: 2021-01-26 — End: ?

## 2021-01-27 DIAGNOSIS — R52 Pain, unspecified: Principal | ICD-10-CM

## 2021-01-27 MED ORDER — MORPHINE ER 15 MG TABLET,EXTENDED RELEASE
ORAL_TABLET | Freq: Three times a day (TID) | ORAL | 0 refills | 30 days | Status: CP
Start: 2021-01-27 — End: ?

## 2021-02-01 DIAGNOSIS — M545 Chronic low back pain, unspecified back pain laterality, unspecified whether sciatica present: Principal | ICD-10-CM

## 2021-02-01 DIAGNOSIS — R52 Pain, unspecified: Principal | ICD-10-CM

## 2021-02-01 DIAGNOSIS — G8929 Other chronic pain: Principal | ICD-10-CM

## 2021-02-01 MED ORDER — OXYCODONE-ACETAMINOPHEN 7.5 MG-325 MG TABLET
ORAL_TABLET | 0 refills | 0 days | Status: CP
Start: 2021-02-01 — End: ?

## 2021-02-07 DIAGNOSIS — G8929 Other chronic pain: Principal | ICD-10-CM

## 2021-02-07 DIAGNOSIS — R52 Pain, unspecified: Principal | ICD-10-CM

## 2021-02-07 DIAGNOSIS — M545 Chronic low back pain, unspecified back pain laterality, unspecified whether sciatica present: Principal | ICD-10-CM

## 2021-02-07 MED ORDER — OXYCODONE-ACETAMINOPHEN 7.5 MG-325 MG TABLET
ORAL_TABLET | 0 refills | 0 days | Status: CP
Start: 2021-02-07 — End: ?

## 2021-02-08 MED ORDER — CYCLOBENZAPRINE 5 MG TABLET
ORAL_TABLET | Freq: Three times a day (TID) | ORAL | 1 refills | 30.00000 days | Status: CP
Start: 2021-02-08 — End: ?

## 2021-02-13 DIAGNOSIS — E1140 Type 2 diabetes mellitus with diabetic neuropathy, unspecified: Principal | ICD-10-CM

## 2021-02-13 MED ORDER — MOUNJARO 5 MG/0.5 ML SUBCUTANEOUS PEN INJECTOR
SUBCUTANEOUS | 0 refills | 0 days | Status: CP
Start: 2021-02-13 — End: ?

## 2021-02-13 MED ORDER — MOUNJARO 2.5 MG/0.5 ML SUBCUTANEOUS PEN INJECTOR
10 refills | 0 days
Start: 2021-02-13 — End: ?

## 2021-02-15 ENCOUNTER — Telehealth: Admit: 2021-02-15 | Discharge: 2021-02-16 | Payer: MEDICARE

## 2021-02-15 DIAGNOSIS — E1140 Type 2 diabetes mellitus with diabetic neuropathy, unspecified: Principal | ICD-10-CM

## 2021-02-15 MED ORDER — MOUNJARO 2.5 MG/0.5 ML SUBCUTANEOUS PEN INJECTOR
10 refills | 0 days
Start: 2021-02-15 — End: ?

## 2021-02-15 MED ORDER — BICTEGRAVIR 50 MG-EMTRICITABINE 200 MG-TENOFOVIR ALAFENAM 25 MG TABLET
ORAL_TABLET | Freq: Every day | ORAL | 11 refills | 30 days | Status: CP
Start: 2021-02-15 — End: ?

## 2021-02-16 MED ORDER — MOUNJARO 5 MG/0.5 ML SUBCUTANEOUS PEN INJECTOR
10 refills | 0 days
Start: 2021-02-16 — End: ?

## 2021-02-17 MED ORDER — MOUNJARO 5 MG/0.5 ML SUBCUTANEOUS PEN INJECTOR
10 refills | 0 days
Start: 2021-02-17 — End: ?

## 2021-02-22 MED ORDER — MOUNJARO 5 MG/0.5 ML SUBCUTANEOUS PEN INJECTOR
10 refills | 0 days
Start: 2021-02-22 — End: ?

## 2021-02-23 MED ORDER — MOUNJARO 5 MG/0.5 ML SUBCUTANEOUS PEN INJECTOR
10 refills | 0 days
Start: 2021-02-23 — End: ?

## 2021-02-28 MED ORDER — MOUNJARO 5 MG/0.5 ML SUBCUTANEOUS PEN INJECTOR
10 refills | 0 days
Start: 2021-02-28 — End: ?

## 2021-02-28 MED ORDER — PREGABALIN 50 MG CAPSULE
ORAL_CAPSULE | 0 refills | 0 days
Start: 2021-02-28 — End: ?

## 2021-03-01 MED ORDER — PREGABALIN 50 MG CAPSULE
ORAL_CAPSULE | 0 refills | 0 days | Status: CP
Start: 2021-03-01 — End: ?

## 2021-03-01 MED ORDER — MOUNJARO 5 MG/0.5 ML SUBCUTANEOUS PEN INJECTOR
10 refills | 0 days
Start: 2021-03-01 — End: ?

## 2021-03-02 MED ORDER — CLOTRIMAZOLE-BETAMETHASONE 1 %-0.05 % TOPICAL CREAM
10 refills | 0 days
Start: 2021-03-02 — End: ?

## 2021-03-06 MED ORDER — MOUNJARO 5 MG/0.5 ML SUBCUTANEOUS PEN INJECTOR
10 refills | 0 days
Start: 2021-03-06 — End: ?

## 2021-03-06 MED ORDER — CLOTRIMAZOLE-BETAMETHASONE 1 %-0.05 % TOPICAL CREAM
10 refills | 0 days | Status: CP
Start: 2021-03-06 — End: ?

## 2021-03-08 MED ORDER — MOUNJARO 7.5 MG/0.5 ML SUBCUTANEOUS PEN INJECTOR
SUBCUTANEOUS | 0 refills | 0 days | Status: CP
Start: 2021-03-08 — End: ?

## 2021-03-08 MED ORDER — MOUNJARO 5 MG/0.5 ML SUBCUTANEOUS PEN INJECTOR
10 refills | 0 days
Start: 2021-03-08 — End: ?

## 2021-03-12 MED ORDER — MOUNJARO 5 MG/0.5 ML SUBCUTANEOUS PEN INJECTOR
10 refills | 0 days
Start: 2021-03-12 — End: ?

## 2021-03-16 MED ORDER — MOUNJARO 5 MG/0.5 ML SUBCUTANEOUS PEN INJECTOR
SUBCUTANEOUS | 0 refills | 0 days | Status: CP
Start: 2021-03-16 — End: ?

## 2021-03-17 DIAGNOSIS — M545 Chronic low back pain, unspecified back pain laterality, unspecified whether sciatica present: Principal | ICD-10-CM

## 2021-03-17 DIAGNOSIS — R52 Pain, unspecified: Principal | ICD-10-CM

## 2021-03-17 DIAGNOSIS — G8929 Other chronic pain: Principal | ICD-10-CM

## 2021-03-17 MED ORDER — MORPHINE ER 15 MG TABLET,EXTENDED RELEASE
ORAL_TABLET | Freq: Three times a day (TID) | ORAL | 0 refills | 30 days | Status: CP
Start: 2021-03-17 — End: ?

## 2021-03-17 MED ORDER — OXYCODONE-ACETAMINOPHEN 7.5 MG-325 MG TABLET
ORAL_TABLET | 0 refills | 0 days | Status: CP
Start: 2021-03-17 — End: ?

## 2021-03-20 MED ORDER — MOUNJARO 5 MG/0.5 ML SUBCUTANEOUS PEN INJECTOR
10 refills | 0 days
Start: 2021-03-20 — End: ?

## 2021-03-23 MED ORDER — PREGABALIN 50 MG CAPSULE
ORAL_CAPSULE | 0 refills | 0 days
Start: 2021-03-23 — End: ?

## 2021-03-23 MED ORDER — MOUNJARO 5 MG/0.5 ML SUBCUTANEOUS PEN INJECTOR
10 refills | 0 days
Start: 2021-03-23 — End: ?

## 2021-03-24 MED ORDER — PREGABALIN 50 MG CAPSULE
ORAL_CAPSULE | 0 refills | 0 days | Status: CP
Start: 2021-03-24 — End: ?

## 2021-03-27 ENCOUNTER — Ambulatory Visit: Admit: 2021-03-27 | Discharge: 2021-03-28 | Payer: MEDICARE

## 2021-03-27 DIAGNOSIS — E1140 Type 2 diabetes mellitus with diabetic neuropathy, unspecified: Principal | ICD-10-CM

## 2021-03-27 DIAGNOSIS — F17200 Nicotine dependence, unspecified, uncomplicated: Principal | ICD-10-CM

## 2021-03-27 DIAGNOSIS — G4733 Obstructive sleep apnea (adult) (pediatric): Principal | ICD-10-CM

## 2021-03-27 DIAGNOSIS — M87051 Idiopathic aseptic necrosis of right femur: Principal | ICD-10-CM

## 2021-03-27 DIAGNOSIS — M545 Chronic low back pain, unspecified back pain laterality, unspecified whether sciatica present: Principal | ICD-10-CM

## 2021-03-27 DIAGNOSIS — R52 Pain, unspecified: Principal | ICD-10-CM

## 2021-03-27 DIAGNOSIS — G8929 Other chronic pain: Principal | ICD-10-CM

## 2021-03-27 DIAGNOSIS — B20 Human immunodeficiency virus [HIV] disease: Principal | ICD-10-CM

## 2021-03-27 DIAGNOSIS — M87052 Idiopathic aseptic necrosis of left femur: Principal | ICD-10-CM

## 2021-03-27 DIAGNOSIS — F319 Bipolar disorder, unspecified: Principal | ICD-10-CM

## 2021-03-27 MED ORDER — MOUNJARO 7.5 MG/0.5 ML SUBCUTANEOUS PEN INJECTOR
SUBCUTANEOUS | 0 refills | 0 days | Status: CP
Start: 2021-03-27 — End: ?

## 2021-03-27 MED ORDER — TRAZODONE 100 MG TABLET
ORAL_TABLET | Freq: Every evening | ORAL | 3 refills | 90 days | Status: CP
Start: 2021-03-27 — End: 2021-04-26

## 2021-03-27 MED ORDER — MORPHINE ER 15 MG TABLET,EXTENDED RELEASE
ORAL_TABLET | Freq: Three times a day (TID) | ORAL | 0 refills | 30 days | Status: CP
Start: 2021-03-27 — End: ?

## 2021-03-28 MED ORDER — OXYCODONE ER 10 MG TABLET,CRUSH RESISTANT,EXTENDED RELEASE 12 HR
ORAL_TABLET | Freq: Every evening | ORAL | 0 refills | 30 days | Status: CP
Start: 2021-03-28 — End: ?

## 2021-03-29 MED ORDER — MOUNJARO 5 MG/0.5 ML SUBCUTANEOUS PEN INJECTOR
10 refills | 0 days
Start: 2021-03-29 — End: ?

## 2021-03-30 MED ORDER — MOUNJARO 5 MG/0.5 ML SUBCUTANEOUS PEN INJECTOR
10 refills | 0 days
Start: 2021-03-30 — End: ?

## 2021-04-04 MED ORDER — MOUNJARO 5 MG/0.5 ML SUBCUTANEOUS PEN INJECTOR
10 refills | 0 days
Start: 2021-04-04 — End: ?

## 2021-04-06 ENCOUNTER — Ambulatory Visit: Admit: 2021-04-06 | Discharge: 2021-04-07 | Payer: MEDICARE

## 2021-04-06 MED ORDER — MOUNJARO 7.5 MG/0.5 ML SUBCUTANEOUS PEN INJECTOR
SUBCUTANEOUS | 0 refills | 0 days | Status: CP
Start: 2021-04-06 — End: ?

## 2021-04-07 MED ORDER — MOUNJARO 5 MG/0.5 ML SUBCUTANEOUS PEN INJECTOR
10 refills | 0 days
Start: 2021-04-07 — End: ?

## 2021-04-09 DIAGNOSIS — D509 Iron deficiency anemia, unspecified: Principal | ICD-10-CM

## 2021-04-10 ENCOUNTER — Ambulatory Visit: Admit: 2021-04-10 | Payer: MEDICARE

## 2021-04-10 MED ORDER — OXYCODONE ER 10 MG TABLET,CRUSH RESISTANT,EXTENDED RELEASE 12 HR
ORAL_TABLET | Freq: Two times a day (BID) | ORAL | 0 refills | 30 days | Status: CP
Start: 2021-04-10 — End: ?

## 2021-04-10 MED ORDER — MOUNJARO 5 MG/0.5 ML SUBCUTANEOUS PEN INJECTOR
10 refills | 0 days
Start: 2021-04-10 — End: ?

## 2021-04-11 MED ORDER — MOUNJARO 5 MG/0.5 ML SUBCUTANEOUS PEN INJECTOR
10 refills | 0 days
Start: 2021-04-11 — End: ?

## 2021-04-12 MED ORDER — MOUNJARO 5 MG/0.5 ML SUBCUTANEOUS PEN INJECTOR
10 refills | 0 days
Start: 2021-04-12 — End: ?

## 2021-04-14 MED ORDER — MOUNJARO 5 MG/0.5 ML SUBCUTANEOUS PEN INJECTOR
10 refills | 0 days
Start: 2021-04-14 — End: ?

## 2021-04-14 MED ORDER — OXYCODONE-ACETAMINOPHEN 7.5 MG-325 MG TABLET
ORAL_TABLET | ORAL | 0 refills | 15 days | Status: CP | PRN
Start: 2021-04-14 — End: 2022-04-14

## 2021-04-24 MED ORDER — PREGABALIN 50 MG CAPSULE
ORAL_CAPSULE | 0 refills | 0 days
Start: 2021-04-24 — End: ?

## 2021-04-24 MED ORDER — MORPHINE ER 15 MG TABLET,EXTENDED RELEASE
ORAL_TABLET | Freq: Two times a day (BID) | ORAL | 0 refills | 45 days | Status: CP
Start: 2021-04-24 — End: ?

## 2021-04-25 ENCOUNTER — Ambulatory Visit: Admit: 2021-04-25 | Discharge: 2021-04-26 | Payer: MEDICARE

## 2021-04-25 DIAGNOSIS — Z7409 Other reduced mobility: Principal | ICD-10-CM

## 2021-04-25 DIAGNOSIS — Z789 Other specified health status: Principal | ICD-10-CM

## 2021-04-25 MED ORDER — PREGABALIN 50 MG CAPSULE
ORAL_CAPSULE | 0 refills | 0 days | Status: CP
Start: 2021-04-25 — End: ?

## 2021-04-27 MED ORDER — OXYCODONE ER 9 MG CAPSULE SPRINKLE EXTENDED RELEASE 12 HR(DON'T CRUSH)
ORAL_CAPSULE | Freq: Two times a day (BID) | ORAL | 0 refills | 30 days | Status: CP
Start: 2021-04-27 — End: ?

## 2021-05-01 ENCOUNTER — Ambulatory Visit: Admit: 2021-05-01 | Discharge: 2021-05-02 | Payer: MEDICARE

## 2021-05-01 MED ORDER — COLCHICINE 0.6 MG TABLET
ORAL_TABLET | ORAL | 0 refills | 0.00000 days | Status: CP
Start: 2021-05-01 — End: ?

## 2021-05-01 MED ORDER — CEFADROXIL 500 MG CAPSULE
ORAL_CAPSULE | Freq: Every day | ORAL | 0 refills | 7 days | Status: CP
Start: 2021-05-01 — End: ?

## 2021-05-02 MED ORDER — COLCHICINE 0.6 MG TABLET
ORAL_TABLET | 0 refills | 0 days | Status: CP
Start: 2021-05-02 — End: ?

## 2021-05-14 DIAGNOSIS — D509 Iron deficiency anemia, unspecified: Principal | ICD-10-CM

## 2021-05-15 ENCOUNTER — Encounter: Admit: 2021-05-15 | Payer: MEDICARE

## 2021-05-15 ENCOUNTER — Ambulatory Visit
Admit: 2021-05-15 | Discharge: 2021-06-05 | Disposition: A | Discharge status: Skilled Nursing Facility | Payer: MEDICARE

## 2021-05-15 ENCOUNTER — Ambulatory Visit: Admit: 2021-05-15 | Payer: MEDICARE

## 2021-05-17 MED ORDER — NICOTINE (POLACRILEX) 4 MG BUCCAL LOZENGE
BUCCAL | 3 refills | 1.00000 days | PRN
Start: 2021-05-17 — End: 2021-06-16

## 2021-05-22 MED ORDER — ONDANSETRON HCL 4 MG TABLET
ORAL_TABLET | 10 refills | 0 days
Start: 2021-05-22 — End: ?

## 2021-05-24 MED ORDER — FARXIGA 10 MG TABLET
ORAL_TABLET | Freq: Every day | ORAL | 0 refills | 30 days
Start: 2021-05-24 — End: 2021-05-24

## 2021-05-24 MED ORDER — JARDIANCE 10 MG TABLET
ORAL_TABLET | Freq: Every day | ORAL | 0 refills | 30 days
Start: 2021-05-24 — End: 2021-05-24

## 2021-05-24 MED ORDER — ENTRESTO 24 MG-26 MG TABLET
ORAL_TABLET | Freq: Two times a day (BID) | ORAL | 0 refills | 30 days
Start: 2021-05-24 — End: 2021-05-24

## 2021-05-26 MED ORDER — CLOPIDOGREL 75 MG TABLET
ORAL_TABLET | Freq: Every day | ORAL | 0 refills | 30 days
Start: 2021-05-26 — End: 2021-05-26

## 2021-05-26 MED ORDER — TICAGRELOR 90 MG TABLET
ORAL_TABLET | Freq: Two times a day (BID) | ORAL | 0 refills | 30 days
Start: 2021-05-26 — End: 2021-05-26

## 2021-05-26 MED ORDER — PRASUGREL 10 MG TABLET
ORAL_TABLET | Freq: Every day | ORAL | 0 refills | 30 days
Start: 2021-05-26 — End: 2021-05-26

## 2021-05-29 MED ORDER — SACUBITRIL 24 MG-VALSARTAN 26 MG TABLET
ORAL_TABLET | Freq: Two times a day (BID) | ORAL | 0 refills | 30 days
Start: 2021-05-29 — End: 2021-05-29

## 2021-05-29 MED ORDER — JARDIANCE 10 MG TABLET
ORAL_TABLET | Freq: Every day | ORAL | 0 refills | 30 days
Start: 2021-05-29 — End: 2021-05-29

## 2021-05-29 MED ORDER — DAPAGLIFLOZIN 10 MG TABLET
ORAL_TABLET | Freq: Every day | ORAL | 0 refills | 30 days
Start: 2021-05-29 — End: 2021-05-29

## 2021-06-01 MED ORDER — BICTEGRAVIR 50 MG-EMTRICITABINE 200 MG-TENOFOVIR ALAFENAM 25 MG TABLET
ORAL_TABLET | Freq: Every day | ORAL | 0 refills | 30 days | Status: CP
Start: 2021-06-01 — End: ?

## 2021-06-11 DIAGNOSIS — D509 Iron deficiency anemia, unspecified: Principal | ICD-10-CM

## 2021-06-14 ENCOUNTER — Ambulatory Visit: Admit: 2021-06-14 | Discharge: 2021-06-15 | Payer: MEDICARE

## 2021-06-14 DIAGNOSIS — I214 Non-ST elevation (NSTEMI) myocardial infarction: Principal | ICD-10-CM

## 2021-06-14 DIAGNOSIS — B20 Human immunodeficiency virus [HIV] disease: Principal | ICD-10-CM

## 2021-06-14 DIAGNOSIS — Z1239 Encounter for other screening for malignant neoplasm of breast: Principal | ICD-10-CM

## 2021-06-14 DIAGNOSIS — Z1231 Encounter for screening mammogram for malignant neoplasm of breast: Principal | ICD-10-CM

## 2021-06-14 DIAGNOSIS — G4733 Obstructive sleep apnea (adult) (pediatric): Principal | ICD-10-CM

## 2021-06-14 MED ORDER — BICTEGRAVIR 50 MG-EMTRICITABINE 200 MG-TENOFOVIR ALAFENAM 25 MG TABLET
ORAL_TABLET | Freq: Every day | ORAL | 11 refills | 30 days | Status: CP
Start: 2021-06-14 — End: ?

## 2021-06-15 DIAGNOSIS — R600 Localized edema: Principal | ICD-10-CM

## 2021-06-15 DIAGNOSIS — R52 Pain, unspecified: Principal | ICD-10-CM

## 2021-06-15 DIAGNOSIS — M545 Chronic low back pain, unspecified back pain laterality, unspecified whether sciatica present: Principal | ICD-10-CM

## 2021-06-15 DIAGNOSIS — G8929 Other chronic pain: Principal | ICD-10-CM

## 2021-06-15 MED ORDER — OXYCODONE-ACETAMINOPHEN 7.5 MG-325 MG TABLET
ORAL_TABLET | Freq: Four times a day (QID) | ORAL | 0 refills | 23 days | PRN
Start: 2021-06-15 — End: 2021-07-15

## 2021-06-15 MED ORDER — MORPHINE ER 15 MG TABLET,EXTENDED RELEASE
ORAL_TABLET | Freq: Three times a day (TID) | ORAL | 0 refills | 30 days
Start: 2021-06-15 — End: 2021-07-15

## 2021-06-16 MED ORDER — ONDANSETRON HCL 4 MG TABLET
ORAL_TABLET | Freq: Three times a day (TID) | ORAL | 1 refills | 30 days | Status: CP | PRN
Start: 2021-06-16 — End: 2022-06-16

## 2021-06-19 MED ORDER — SPIRONOLACTONE 25 MG TABLET
ORAL_TABLET | Freq: Every day | ORAL | 1 refills | 90 days | Status: CP
Start: 2021-06-19 — End: 2022-06-19

## 2021-06-19 MED ORDER — METOPROLOL SUCCINATE ER 50 MG TABLET,EXTENDED RELEASE 24 HR
ORAL_TABLET | Freq: Every day | ORAL | 1 refills | 90 days | Status: CP
Start: 2021-06-19 — End: 2022-06-19

## 2021-06-19 MED ORDER — SACUBITRIL 24 MG-VALSARTAN 26 MG TABLET
ORAL_TABLET | Freq: Two times a day (BID) | ORAL | 1 refills | 90 days | Status: CP
Start: 2021-06-19 — End: 2022-06-19

## 2021-06-19 MED ORDER — ASPIRIN 81 MG CHEWABLE TABLET
ORAL_TABLET | Freq: Every day | ORAL | 1 refills | 90 days | Status: CP
Start: 2021-06-19 — End: 2022-06-19

## 2021-06-19 MED ORDER — TICAGRELOR 90 MG TABLET
ORAL_TABLET | Freq: Two times a day (BID) | ORAL | 1 refills | 90 days | Status: CP
Start: 2021-06-19 — End: 2022-06-19

## 2021-06-19 MED ORDER — FUROSEMIDE 40 MG TABLET
ORAL_TABLET | Freq: Every day | ORAL | 1 refills | 90 days | Status: CP
Start: 2021-06-19 — End: 2022-06-19

## 2021-06-19 MED ORDER — OXYCODONE-ACETAMINOPHEN 7.5 MG-325 MG TABLET
ORAL_TABLET | Freq: Four times a day (QID) | ORAL | 0 refills | 23 days | Status: CP | PRN
Start: 2021-06-19 — End: 2021-07-19

## 2021-06-19 MED ORDER — ATORVASTATIN 80 MG TABLET
ORAL_TABLET | Freq: Every day | ORAL | 1 refills | 90 days | Status: CP
Start: 2021-06-19 — End: 2022-06-19

## 2021-06-19 MED ORDER — MORPHINE ER 15 MG TABLET,EXTENDED RELEASE
ORAL_TABLET | Freq: Three times a day (TID) | ORAL | 0 refills | 30 days | Status: CP
Start: 2021-06-19 — End: 2021-07-19

## 2021-06-19 MED ORDER — EMPAGLIFLOZIN 10 MG TABLET
ORAL_TABLET | Freq: Every day | ORAL | 1 refills | 90 days | Status: CP
Start: 2021-06-19 — End: 2022-06-19

## 2021-06-21 DIAGNOSIS — G8929 Other chronic pain: Principal | ICD-10-CM

## 2021-06-21 DIAGNOSIS — B20 Human immunodeficiency virus [HIV] disease: Principal | ICD-10-CM

## 2021-06-21 DIAGNOSIS — R52 Pain, unspecified: Principal | ICD-10-CM

## 2021-06-21 DIAGNOSIS — R600 Localized edema: Principal | ICD-10-CM

## 2021-06-21 DIAGNOSIS — M545 Chronic low back pain, unspecified back pain laterality, unspecified whether sciatica present: Principal | ICD-10-CM

## 2021-06-21 MED ORDER — MOUNJARO 5 MG/0.5 ML SUBCUTANEOUS PEN INJECTOR
SUBCUTANEOUS | 0 refills | 0 days | Status: CP
Start: 2021-06-21 — End: 2022-06-21

## 2021-06-21 MED ORDER — ONDANSETRON HCL 4 MG TABLET
ORAL_TABLET | Freq: Three times a day (TID) | ORAL | 1 refills | 30 days | Status: CP | PRN
Start: 2021-06-21 — End: 2022-06-21

## 2021-06-21 MED ORDER — MORPHINE ER 15 MG TABLET,EXTENDED RELEASE
ORAL_TABLET | Freq: Three times a day (TID) | ORAL | 0 refills | 30 days | Status: CP
Start: 2021-06-21 — End: 2021-07-21

## 2021-06-21 MED ORDER — LIDOCAINE 5 % TOPICAL PATCH
MEDICATED_PATCH | Freq: Two times a day (BID) | TRANSDERMAL | 1 refills | 45 days | Status: CP
Start: 2021-06-21 — End: 2022-06-21

## 2021-06-21 MED ORDER — SPIRONOLACTONE 25 MG TABLET
ORAL_TABLET | Freq: Every day | ORAL | 1 refills | 90 days | Status: CP
Start: 2021-06-21 — End: 2022-06-21

## 2021-06-21 MED ORDER — VRAYLAR 3 MG CAPSULE
ORAL_CAPSULE | Freq: Every day | ORAL | 1 refills | 90 days | Status: CP
Start: 2021-06-21 — End: 2022-06-21

## 2021-06-21 MED ORDER — TRAZODONE 100 MG TABLET
ORAL_TABLET | Freq: Every evening | ORAL | 1 refills | 90 days | Status: CP
Start: 2021-06-21 — End: 2022-06-21

## 2021-06-21 MED ORDER — EMPAGLIFLOZIN 10 MG TABLET
ORAL_TABLET | Freq: Every day | ORAL | 1 refills | 90 days | Status: CP
Start: 2021-06-21 — End: 2022-06-21

## 2021-06-21 MED ORDER — TICAGRELOR 90 MG TABLET
ORAL_TABLET | Freq: Two times a day (BID) | ORAL | 1 refills | 90 days | Status: CP
Start: 2021-06-21 — End: 2022-06-21

## 2021-06-21 MED ORDER — METOPROLOL SUCCINATE ER 50 MG TABLET,EXTENDED RELEASE 24 HR
ORAL_TABLET | Freq: Every day | ORAL | 1 refills | 90 days | Status: CP
Start: 2021-06-21 — End: 2022-06-21

## 2021-06-21 MED ORDER — OXYCODONE-ACETAMINOPHEN 7.5 MG-325 MG TABLET
ORAL_TABLET | Freq: Four times a day (QID) | ORAL | 0 refills | 23 days | Status: CP | PRN
Start: 2021-06-21 — End: 2021-07-21

## 2021-06-21 MED ORDER — ATORVASTATIN 80 MG TABLET
ORAL_TABLET | Freq: Every day | ORAL | 1 refills | 90 days | Status: CP
Start: 2021-06-21 — End: 2022-06-21

## 2021-06-21 MED ORDER — OMEPRAZOLE 20 MG CAPSULE,DELAYED RELEASE
ORAL_CAPSULE | Freq: Every day | ORAL | 1 refills | 90 days | Status: CP
Start: 2021-06-21 — End: 2022-06-21

## 2021-06-21 MED ORDER — ASPIRIN 81 MG CHEWABLE TABLET
ORAL_TABLET | Freq: Every day | ORAL | 1 refills | 90 days | Status: CP
Start: 2021-06-21 — End: 2022-06-21

## 2021-06-21 MED ORDER — FUROSEMIDE 40 MG TABLET
ORAL_TABLET | Freq: Every day | ORAL | 1 refills | 90 days | Status: CP
Start: 2021-06-21 — End: 2022-06-21

## 2021-06-21 MED ORDER — BICTEGRAVIR 50 MG-EMTRICITABINE 200 MG-TENOFOVIR ALAFENAM 25 MG TABLET
ORAL_TABLET | Freq: Every day | ORAL | 1 refills | 90 days | Status: CP
Start: 2021-06-21 — End: 2022-06-21

## 2021-06-21 MED ORDER — SACUBITRIL 24 MG-VALSARTAN 26 MG TABLET
ORAL_TABLET | Freq: Two times a day (BID) | ORAL | 1 refills | 90 days | Status: CP
Start: 2021-06-21 — End: 2022-06-21

## 2021-06-22 ENCOUNTER — Ambulatory Visit: Admit: 2021-06-22 | Discharge: 2021-06-23 | Payer: MEDICARE

## 2021-06-22 DIAGNOSIS — Z9181 History of falling: Principal | ICD-10-CM

## 2021-06-22 DIAGNOSIS — G8929 Other chronic pain: Principal | ICD-10-CM

## 2021-06-22 DIAGNOSIS — M545 Chronic low back pain, unspecified back pain laterality, unspecified whether sciatica present: Principal | ICD-10-CM

## 2021-06-22 MED ORDER — TRAZODONE 100 MG TABLET
ORAL_TABLET | Freq: Every evening | ORAL | 3 refills | 90 days | Status: CP
Start: 2021-06-22 — End: 2021-07-22

## 2021-06-22 MED ORDER — NYSTATIN 100,000 UNIT/GRAM TOPICAL POWDER
1 refills | 0 days | Status: CP
Start: 2021-06-22 — End: 2022-06-22

## 2021-06-22 MED ORDER — NITROGLYCERIN 0.3 MG SUBLINGUAL TABLET
ORAL_TABLET | SUBLINGUAL | 3 refills | 1 days | Status: CP | PRN
Start: 2021-06-22 — End: 2022-06-22

## 2021-06-28 DIAGNOSIS — Z5181 Encounter for therapeutic drug level monitoring: Principal | ICD-10-CM

## 2021-06-29 ENCOUNTER — Ambulatory Visit: Admit: 2021-06-29 | Discharge: 2021-06-30 | Payer: MEDICARE

## 2021-06-29 DIAGNOSIS — R399 Unspecified symptoms and signs involving the genitourinary system: Principal | ICD-10-CM

## 2021-06-29 DIAGNOSIS — Z5181 Encounter for therapeutic drug level monitoring: Principal | ICD-10-CM

## 2021-06-29 MED ORDER — CIPROFLOXACIN 500 MG TABLET
ORAL_TABLET | Freq: Two times a day (BID) | ORAL | 0 refills | 7 days | Status: CP
Start: 2021-06-29 — End: 2021-07-06

## 2021-06-30 ENCOUNTER — Ambulatory Visit: Admit: 2021-06-30 | Discharge: 2021-07-01 | Payer: MEDICARE

## 2021-06-30 DIAGNOSIS — I214 Non-ST elevation (NSTEMI) myocardial infarction: Principal | ICD-10-CM

## 2021-06-30 DIAGNOSIS — I255 Ischemic cardiomyopathy: Principal | ICD-10-CM

## 2021-06-30 DIAGNOSIS — I5020 Unspecified systolic (congestive) heart failure: Principal | ICD-10-CM

## 2021-06-30 DIAGNOSIS — I249 Acute ischemic heart disease, unspecified: Principal | ICD-10-CM

## 2021-07-03 DIAGNOSIS — R52 Pain, unspecified: Principal | ICD-10-CM

## 2021-07-03 DIAGNOSIS — G8929 Other chronic pain: Principal | ICD-10-CM

## 2021-07-03 DIAGNOSIS — M545 Chronic low back pain, unspecified back pain laterality, unspecified whether sciatica present: Principal | ICD-10-CM

## 2021-07-03 MED ORDER — OXYCODONE-ACETAMINOPHEN 7.5 MG-325 MG TABLET
ORAL_TABLET | Freq: Four times a day (QID) | ORAL | 0 refills | 23 days | Status: CN | PRN
Start: 2021-07-03 — End: 2021-08-02

## 2021-07-03 MED ORDER — MORPHINE ER 15 MG TABLET,EXTENDED RELEASE
ORAL_TABLET | Freq: Three times a day (TID) | ORAL | 0 refills | 30 days | Status: CN
Start: 2021-07-03 — End: 2021-08-02

## 2021-07-09 DIAGNOSIS — R52 Pain, unspecified: Principal | ICD-10-CM

## 2021-07-09 DIAGNOSIS — D509 Iron deficiency anemia, unspecified: Principal | ICD-10-CM

## 2021-07-09 DIAGNOSIS — M545 Chronic low back pain, unspecified back pain laterality, unspecified whether sciatica present: Principal | ICD-10-CM

## 2021-07-09 DIAGNOSIS — G8929 Other chronic pain: Principal | ICD-10-CM

## 2021-07-10 MED ORDER — OXYCODONE-ACETAMINOPHEN 7.5 MG-325 MG TABLET
ORAL_TABLET | Freq: Four times a day (QID) | ORAL | 0 refills | 23 days | Status: CP | PRN
Start: 2021-07-10 — End: 2021-08-09

## 2021-07-10 MED ORDER — MORPHINE ER 15 MG TABLET,EXTENDED RELEASE
ORAL_TABLET | Freq: Three times a day (TID) | ORAL | 0 refills | 30 days | Status: CP
Start: 2021-07-10 — End: 2021-08-09

## 2021-07-12 ENCOUNTER — Ambulatory Visit: Admit: 2021-07-12 | Payer: MEDICARE

## 2021-07-12 ENCOUNTER — Ambulatory Visit: Admit: 2021-07-12 | Payer: MEDICARE | Attending: Physician Assistant | Primary: Physician Assistant

## 2021-07-12 DIAGNOSIS — M545 Chronic low back pain, unspecified back pain laterality, unspecified whether sciatica present: Principal | ICD-10-CM

## 2021-07-12 DIAGNOSIS — G8929 Other chronic pain: Principal | ICD-10-CM

## 2021-07-12 DIAGNOSIS — R52 Pain, unspecified: Principal | ICD-10-CM

## 2021-07-12 MED ORDER — OXYCODONE-ACETAMINOPHEN 7.5 MG-325 MG TABLET
ORAL_TABLET | Freq: Four times a day (QID) | ORAL | 0 refills | 23 days | Status: CP | PRN
Start: 2021-07-12 — End: 2021-08-11

## 2021-07-12 MED ORDER — MORPHINE ER 15 MG TABLET,EXTENDED RELEASE
ORAL_TABLET | Freq: Three times a day (TID) | ORAL | 0 refills | 30 days | Status: CP
Start: 2021-07-12 — End: 2021-08-11

## 2021-07-13 ENCOUNTER — Ambulatory Visit: Admit: 2021-07-13 | Payer: MEDICARE

## 2021-07-14 ENCOUNTER — Ambulatory Visit: Admit: 2021-07-14 | Discharge: 2021-07-15 | Disposition: A | Discharge status: Home / Self Care | Payer: MEDICARE

## 2021-07-14 ENCOUNTER — Emergency Department: Admit: 2021-07-14 | Discharge: 2021-07-15 | Disposition: A | Discharge status: Home / Self Care | Payer: MEDICARE

## 2021-07-14 DIAGNOSIS — I959 Hypotension, unspecified: Principal | ICD-10-CM

## 2021-07-14 DIAGNOSIS — N939 Abnormal uterine and vaginal bleeding, unspecified: Principal | ICD-10-CM

## 2021-07-14 MED ORDER — MEDROXYPROGESTERONE 10 MG TABLET
ORAL_TABLET | Freq: Three times a day (TID) | ORAL | 0 refills | 7 days | Status: CP
Start: 2021-07-14 — End: 2021-07-14

## 2021-07-19 ENCOUNTER — Ambulatory Visit: Admit: 2021-07-19 | Discharge: 2021-07-20 | Payer: MEDICARE

## 2021-07-19 DIAGNOSIS — I5020 Unspecified systolic (congestive) heart failure: Principal | ICD-10-CM

## 2021-07-19 DIAGNOSIS — R399 Unspecified symptoms and signs involving the genitourinary system: Principal | ICD-10-CM

## 2021-07-19 DIAGNOSIS — E1140 Type 2 diabetes mellitus with diabetic neuropathy, unspecified: Principal | ICD-10-CM

## 2021-07-19 DIAGNOSIS — M25551 Pain in right hip: Principal | ICD-10-CM

## 2021-07-19 DIAGNOSIS — R809 Proteinuria, unspecified: Principal | ICD-10-CM

## 2021-07-19 DIAGNOSIS — M25512 Pain in left shoulder: Principal | ICD-10-CM

## 2021-07-19 DIAGNOSIS — I214 Non-ST elevation (NSTEMI) myocardial infarction: Principal | ICD-10-CM

## 2021-07-19 DIAGNOSIS — M25552 Pain in left hip: Principal | ICD-10-CM

## 2021-07-19 MED ORDER — MOUNJARO 7.5 MG/0.5 ML SUBCUTANEOUS PEN INJECTOR
SUBCUTANEOUS | 0 refills | 0 days | Status: CP
Start: 2021-07-19 — End: ?

## 2021-07-19 MED ORDER — TICAGRELOR 90 MG TABLET
ORAL_TABLET | Freq: Two times a day (BID) | ORAL | 1 refills | 90 days | Status: CP
Start: 2021-07-19 — End: 2022-07-19

## 2021-07-22 ENCOUNTER — Ambulatory Visit
Admit: 2021-07-22 | Discharge: 2021-07-22 | Disposition: A | Discharge status: Home / Self Care | Payer: MEDICARE | Attending: Emergency Medicine

## 2021-07-25 ENCOUNTER — Ambulatory Visit: Admit: 2021-07-25 | Payer: MEDICARE

## 2021-07-31 ENCOUNTER — Ambulatory Visit: Admit: 2021-07-31 | Payer: MEDICARE | Attending: Internal Medicine | Primary: Internal Medicine

## 2021-08-08 MED ORDER — MORPHINE ER 15 MG TABLET,EXTENDED RELEASE
ORAL_TABLET | Freq: Three times a day (TID) | ORAL | 0 refills | 30 days
Start: 2021-08-08 — End: 2021-09-07

## 2021-08-08 MED ORDER — OXYCODONE-ACETAMINOPHEN 7.5 MG-325 MG TABLET
ORAL_TABLET | Freq: Four times a day (QID) | ORAL | 0 refills | 23 days | PRN
Start: 2021-08-08 — End: 2021-09-07

## 2021-08-09 MED ORDER — MORPHINE ER 15 MG TABLET,EXTENDED RELEASE
ORAL_TABLET | Freq: Three times a day (TID) | ORAL | 0 refills | 30 days | Status: CP
Start: 2021-08-09 — End: 2021-09-08

## 2021-08-09 MED ORDER — MEDICAL SUPPLY ITEM
1 refills | 0 days | Status: CP
Start: 2021-08-09 — End: ?

## 2021-08-09 MED ORDER — OXYCODONE-ACETAMINOPHEN 7.5 MG-325 MG TABLET
ORAL_TABLET | Freq: Four times a day (QID) | ORAL | 0 refills | 23 days | Status: CP | PRN
Start: 2021-08-09 — End: 2021-09-08

## 2021-08-10 DIAGNOSIS — G8929 Other chronic pain: Principal | ICD-10-CM

## 2021-08-10 DIAGNOSIS — R52 Pain, unspecified: Principal | ICD-10-CM

## 2021-08-10 DIAGNOSIS — M545 Chronic low back pain, unspecified back pain laterality, unspecified whether sciatica present: Principal | ICD-10-CM

## 2021-08-10 MED ORDER — MORPHINE ER 15 MG TABLET,EXTENDED RELEASE
ORAL_TABLET | Freq: Three times a day (TID) | ORAL | 0 refills | 30 days | Status: CP
Start: 2021-08-10 — End: 2021-09-09

## 2021-08-10 MED ORDER — OXYCODONE-ACETAMINOPHEN 7.5 MG-325 MG TABLET
ORAL_TABLET | Freq: Four times a day (QID) | ORAL | 0 refills | 23.00000 days | Status: CP | PRN
Start: 2021-08-10 — End: 2021-09-09

## 2021-08-10 MED ORDER — ONDANSETRON HCL 4 MG TABLET
ORAL_TABLET | 10 refills | 0 days
Start: 2021-08-10 — End: ?

## 2021-08-11 MED ORDER — ONDANSETRON HCL 4 MG TABLET
ORAL_TABLET | 10 refills | 0 days | Status: CP
Start: 2021-08-11 — End: ?

## 2021-08-21 ENCOUNTER — Ambulatory Visit: Admit: 2021-08-21 | Payer: MEDICARE

## 2021-09-04 MED ORDER — MEDROXYPROGESTERONE 10 MG TABLET
ORAL_TABLET | 0 refills | 0 days
Start: 2021-09-04 — End: ?

## 2021-09-06 ENCOUNTER — Ambulatory Visit: Admit: 2021-09-06 | Discharge: 2021-09-07 | Payer: MEDICARE

## 2021-09-06 DIAGNOSIS — N939 Abnormal uterine and vaginal bleeding, unspecified: Principal | ICD-10-CM

## 2021-09-06 MED ORDER — MEDROXYPROGESTERONE 10 MG TABLET
ORAL_TABLET | ORAL | 0 refills | 90 days | Status: CP
Start: 2021-09-06 — End: 2021-12-05

## 2021-09-08 ENCOUNTER — Ambulatory Visit: Admit: 2021-09-08 | Payer: MEDICARE

## 2021-09-08 DIAGNOSIS — R52 Pain, unspecified: Principal | ICD-10-CM

## 2021-09-08 MED ORDER — MORPHINE ER 15 MG TABLET,EXTENDED RELEASE
ORAL_TABLET | Freq: Three times a day (TID) | ORAL | 0 refills | 30 days | Status: CP
Start: 2021-09-08 — End: 2021-10-08

## 2021-09-08 MED ORDER — OXYCODONE-ACETAMINOPHEN 7.5 MG-325 MG TABLET
ORAL_TABLET | ORAL | 0 refills | 15 days | Status: CP | PRN
Start: 2021-09-08 — End: 2022-09-08

## 2021-09-08 MED ORDER — MEDROXYPROGESTERONE 10 MG TABLET
ORAL_TABLET | 0 refills | 0 days | Status: CP
Start: 2021-09-08 — End: ?

## 2021-09-20 ENCOUNTER — Ambulatory Visit: Admit: 2021-09-20 | Payer: MEDICARE

## 2021-09-21 MED ORDER — FUROSEMIDE 20 MG TABLET
ORAL_TABLET | Freq: Every day | ORAL | 10 refills | 0 days
Start: 2021-09-21 — End: ?

## 2021-09-22 MED ORDER — FUROSEMIDE 20 MG TABLET
ORAL_TABLET | Freq: Every day | ORAL | 10 refills | 30 days
Start: 2021-09-22 — End: ?

## 2021-09-26 DIAGNOSIS — B20 Human immunodeficiency virus [HIV] disease: Principal | ICD-10-CM

## 2021-09-26 MED ORDER — BIKTARVY 50 MG-200 MG-25 MG TABLET
ORAL_TABLET | Freq: Every day | ORAL | 10 refills | 0 days
Start: 2021-09-26 — End: ?

## 2021-09-27 ENCOUNTER — Ambulatory Visit: Admit: 2021-09-27 | Discharge: 2021-09-28 | Payer: MEDICARE

## 2021-09-27 MED ORDER — BIKTARVY 50 MG-200 MG-25 MG TABLET
ORAL_TABLET | Freq: Every day | ORAL | 10 refills | 30 days
Start: 2021-09-27 — End: ?

## 2021-09-28 MED ORDER — TRAZODONE 100 MG TABLET
ORAL_TABLET | 3 refills | 0 days
Start: 2021-09-28 — End: ?

## 2021-09-28 MED ORDER — ENTRESTO 24 MG-26 MG TABLET
ORAL_TABLET | ORAL | 1 refills | 0 days
Start: 2021-09-28 — End: ?

## 2021-09-28 MED ORDER — JARDIANCE 10 MG TABLET
ORAL_TABLET | Freq: Every day | ORAL | 1 refills | 90 days
Start: 2021-09-28 — End: ?

## 2021-09-29 MED ORDER — SACUBITRIL 24 MG-VALSARTAN 26 MG TABLET
ORAL_TABLET | Freq: Two times a day (BID) | ORAL | 1 refills | 90 days | Status: CP
Start: 2021-09-29 — End: 2022-09-29

## 2021-09-29 MED ORDER — ASPIRIN 81 MG CHEWABLE TABLET
ORAL_TABLET | Freq: Every day | ORAL | 1 refills | 90 days | Status: CP
Start: 2021-09-29 — End: 2022-09-29

## 2021-09-29 MED ORDER — TICAGRELOR 90 MG TABLET
ORAL_TABLET | Freq: Two times a day (BID) | ORAL | 1 refills | 90 days | Status: CP
Start: 2021-09-29 — End: 2022-09-29

## 2021-09-29 MED ORDER — EMPAGLIFLOZIN 10 MG TABLET
ORAL_TABLET | Freq: Every day | ORAL | 1 refills | 90 days | Status: CP
Start: 2021-09-29 — End: 2022-09-29

## 2021-10-04 ENCOUNTER — Ambulatory Visit: Admit: 2021-10-04 | Discharge: 2021-10-05 | Payer: MEDICARE

## 2021-10-04 DIAGNOSIS — Z5181 Encounter for therapeutic drug level monitoring: Principal | ICD-10-CM

## 2021-10-08 DIAGNOSIS — D509 Iron deficiency anemia, unspecified: Principal | ICD-10-CM

## 2021-10-25 DIAGNOSIS — M545 Low back pain, unspecified: Principal | ICD-10-CM

## 2021-10-25 DIAGNOSIS — R52 Pain, unspecified: Principal | ICD-10-CM

## 2021-10-25 MED ORDER — OXYCODONE-ACETAMINOPHEN 7.5 MG-325 MG TABLET
ORAL_TABLET | Freq: Four times a day (QID) | ORAL | 0 refills | 0 days | PRN
Start: 2021-10-25 — End: ?

## 2021-10-25 MED ORDER — MOUNJARO 7.5 MG/0.5 ML SUBCUTANEOUS PEN INJECTOR
0 refills | 0 days
Start: 2021-10-25 — End: ?

## 2021-10-27 MED ORDER — OXYCODONE-ACETAMINOPHEN 7.5 MG-325 MG TABLET
ORAL_TABLET | Freq: Four times a day (QID) | ORAL | 0 refills | 23 days | Status: CP | PRN
Start: 2021-10-27 — End: ?

## 2021-10-27 MED ORDER — MOUNJARO 7.5 MG/0.5 ML SUBCUTANEOUS PEN INJECTOR
0 refills | 0 days | Status: CP
Start: 2021-10-27 — End: ?

## 2021-10-31 ENCOUNTER — Telehealth: Admit: 2021-10-31 | Discharge: 2021-11-01 | Payer: MEDICARE

## 2021-10-31 DIAGNOSIS — G4733 Obstructive sleep apnea (adult) (pediatric): Principal | ICD-10-CM

## 2021-11-07 MED ORDER — MEDROXYPROGESTERONE 10 MG TABLET
ORAL_TABLET | 0 refills | 0 days
Start: 2021-11-07 — End: ?

## 2021-11-09 MED ORDER — MEDROXYPROGESTERONE 10 MG TABLET
ORAL_TABLET | 0 refills | 0 days | Status: CP
Start: 2021-11-09 — End: ?

## 2021-11-23 ENCOUNTER — Ambulatory Visit: Admit: 2021-11-23 | Payer: MEDICARE

## 2021-11-23 ENCOUNTER — Institutional Professional Consult (permissible substitution): Admit: 2021-11-23 | Payer: MEDICARE

## 2021-11-23 ENCOUNTER — Ambulatory Visit: Admit: 2021-11-23 | Discharge: 2021-12-22 | Payer: MEDICARE

## 2021-12-01 DIAGNOSIS — R52 Pain, unspecified: Principal | ICD-10-CM

## 2021-12-01 DIAGNOSIS — M545 Low back pain, unspecified: Principal | ICD-10-CM

## 2021-12-01 MED ORDER — OXYCODONE-ACETAMINOPHEN 7.5 MG-325 MG TABLET
ORAL_TABLET | Freq: Four times a day (QID) | ORAL | 0 refills | 0 days | PRN
Start: 2021-12-01 — End: ?

## 2021-12-01 MED ORDER — MORPHINE ER 15 MG TABLET,EXTENDED RELEASE
ORAL_TABLET | ORAL | 0 refills | 0 days
Start: 2021-12-01 — End: ?

## 2021-12-03 ENCOUNTER — Emergency Department: Admit: 2021-12-03 | Discharge: 2021-12-03 | Disposition: A | Discharge status: Home / Self Care | Payer: MEDICARE

## 2021-12-03 ENCOUNTER — Ambulatory Visit: Admit: 2021-12-03 | Discharge: 2021-12-03 | Disposition: A | Discharge status: Home / Self Care | Payer: MEDICARE

## 2021-12-03 DIAGNOSIS — N939 Abnormal uterine and vaginal bleeding, unspecified: Principal | ICD-10-CM

## 2021-12-03 MED ORDER — NAPROXEN 500 MG TABLET
ORAL_TABLET | Freq: Two times a day (BID) | ORAL | 0 refills | 15 days | Status: CP
Start: 2021-12-03 — End: 2021-12-18

## 2021-12-04 DIAGNOSIS — M545 Low back pain, unspecified: Principal | ICD-10-CM

## 2021-12-04 DIAGNOSIS — R52 Pain, unspecified: Principal | ICD-10-CM

## 2021-12-04 MED ORDER — MOUNJARO 7.5 MG/0.5 ML SUBCUTANEOUS PEN INJECTOR
0 refills | 0 days
Start: 2021-12-04 — End: ?

## 2021-12-04 MED ORDER — MEDROXYPROGESTERONE 10 MG TABLET
ORAL_TABLET | ORAL | 0 refills | 0.00000 days
Start: 2021-12-04 — End: 2022-03-03

## 2021-12-04 MED ORDER — OXYCODONE-ACETAMINOPHEN 7.5 MG-325 MG TABLET
ORAL_TABLET | Freq: Four times a day (QID) | ORAL | 0 refills | 23 days | PRN
Start: 2021-12-04 — End: ?

## 2021-12-04 MED ORDER — LIDOCAINE 5 % TOPICAL PATCH
MEDICATED_PATCH | Freq: Two times a day (BID) | TRANSDERMAL | 1 refills | 45 days
Start: 2021-12-04 — End: 2022-12-04

## 2021-12-04 MED ORDER — NITROGLYCERIN 0.3 MG SUBLINGUAL TABLET
ORAL_TABLET | SUBLINGUAL | 3 refills | 1 days | PRN
Start: 2021-12-04 — End: 2022-12-04

## 2021-12-05 MED ORDER — LIDOCAINE 5 % TOPICAL PATCH
MEDICATED_PATCH | Freq: Two times a day (BID) | TRANSDERMAL | 1 refills | 45 days | Status: CP
Start: 2021-12-05 — End: 2022-12-05

## 2021-12-05 MED ORDER — OXYCODONE-ACETAMINOPHEN 7.5 MG-325 MG TABLET
ORAL_TABLET | Freq: Four times a day (QID) | ORAL | 0 refills | 23.00000 days | Status: CP | PRN
Start: 2021-12-05 — End: ?

## 2021-12-05 MED ORDER — MEDROXYPROGESTERONE 10 MG TABLET
ORAL_TABLET | ORAL | 0 refills | 90.00000 days | Status: CP
Start: 2021-12-05 — End: 2022-03-04

## 2021-12-05 MED ORDER — MOUNJARO 7.5 MG/0.5 ML SUBCUTANEOUS PEN INJECTOR
SUBCUTANEOUS | 3 refills | 0 days | Status: CP
Start: 2021-12-05 — End: ?

## 2021-12-05 MED ORDER — NITROGLYCERIN 0.3 MG SUBLINGUAL TABLET
ORAL_TABLET | SUBLINGUAL | 3 refills | 1 days | Status: CP | PRN
Start: 2021-12-05 — End: 2022-12-05

## 2021-12-05 MED ORDER — MORPHINE ER 15 MG TABLET,EXTENDED RELEASE
ORAL_TABLET | ORAL | 0 refills | 0 days | Status: CP
Start: 2021-12-05 — End: ?

## 2021-12-10 DIAGNOSIS — D509 Iron deficiency anemia, unspecified: Principal | ICD-10-CM

## 2021-12-11 ENCOUNTER — Ambulatory Visit: Admit: 2021-12-11 | Payer: MEDICARE

## 2021-12-11 ENCOUNTER — Ambulatory Visit: Admit: 2021-12-11 | Discharge: 2021-12-21 | Disposition: A | Discharge status: Home Health | Payer: MEDICARE

## 2021-12-14 MED ORDER — NITROFURANTOIN MONOHYDRATE/MACROCRYSTALS 100 MG CAPSULE
ORAL_CAPSULE | Freq: Two times a day (BID) | ORAL | 0 refills | 3 days
Start: 2021-12-14 — End: 2021-12-17

## 2021-12-14 MED ORDER — NALOXONE 4 MG/ACTUATION NASAL SPRAY
11 refills | 0 days
Start: 2021-12-14 — End: ?

## 2021-12-14 MED ORDER — MORPHINE ER 15 MG TABLET,EXTENDED RELEASE
ORAL_TABLET | Freq: Two times a day (BID) | ORAL | 0 refills | 45 days
Start: 2021-12-14 — End: ?

## 2021-12-14 MED ORDER — NICOTINE (POLACRILEX) 4 MG GUM
BUCCAL | 0 refills | 5 days | PRN
Start: 2021-12-14 — End: 2022-01-13

## 2021-12-15 MED ORDER — METHYLNALTREXONE 150 MG TABLET
ORAL_TABLET | Freq: Every day | ORAL | 2 refills | 30 days
Start: 2021-12-15 — End: ?

## 2021-12-15 MED ORDER — BICTEGRAVIR 50 MG-EMTRICITABINE 200 MG-TENOFOVIR ALAFENAM 25 MG TABLET
ORAL_TABLET | Freq: Every day | ORAL | 3 refills | 90 days
Start: 2021-12-15 — End: 2022-12-15

## 2021-12-15 MED ORDER — NALOXONE 4 MG/ACTUATION NASAL SPRAY
11 refills | 0 days
Start: 2021-12-15 — End: ?

## 2021-12-15 MED ORDER — NITROFURANTOIN MONOHYDRATE/MACROCRYSTALS 100 MG CAPSULE
ORAL_CAPSULE | Freq: Two times a day (BID) | ORAL | 0 refills | 1 days
Start: 2021-12-15 — End: 2021-12-16

## 2021-12-15 MED ORDER — OMEPRAZOLE 20 MG CAPSULE,DELAYED RELEASE
ORAL_CAPSULE | Freq: Every day | ORAL | 0 refills | 30 days
Start: 2021-12-15 — End: ?

## 2021-12-15 MED ORDER — POTASSIUM CHLORIDE ER 20 MEQ TABLET,EXTENDED RELEASE(PART/CRYST)
ORAL_TABLET | Freq: Every day | ORAL | 0 refills | 30 days
Start: 2021-12-15 — End: ?

## 2021-12-15 MED ORDER — METOPROLOL SUCCINATE ER 25 MG TABLET,EXTENDED RELEASE 24 HR
ORAL_TABLET | Freq: Every day | ORAL | 0 refills | 30.00000 days
Start: 2021-12-15 — End: 2022-01-14

## 2021-12-15 MED ORDER — NICOTINE 21 MG/24 HR DAILY TRANSDERMAL PATCH
MEDICATED_PATCH | Freq: Every day | TRANSDERMAL | 0 refills | 28.00000 days
Start: 2021-12-15 — End: ?

## 2021-12-15 MED ORDER — NICOTINE (POLACRILEX) 4 MG GUM
BUCCAL | 0 refills | 5 days | PRN
Start: 2021-12-15 — End: 2022-01-14

## 2021-12-15 MED ORDER — POLYETHYLENE GLYCOL 3350 17 GRAM ORAL POWDER PACKET
PACK | Freq: Two times a day (BID) | ORAL | 0 refills | 30 days
Start: 2021-12-15 — End: ?

## 2021-12-15 MED ORDER — TRAZODONE 100 MG TABLET
ORAL_TABLET | Freq: Every evening | ORAL | 0 refills | 30 days | Status: CN | PRN
Start: 2021-12-15 — End: 2022-01-14

## 2021-12-15 MED ORDER — NYSTATIN 100,000 UNIT/GRAM TOPICAL POWDER
1 refills | 0 days
Start: 2021-12-15 — End: 2022-12-15

## 2021-12-16 MED ORDER — FUROSEMIDE 40 MG TABLET
ORAL_TABLET | Freq: Every day | ORAL | 0 refills | 30 days
Start: 2021-12-16 — End: ?

## 2021-12-18 MED ORDER — POTASSIUM CHLORIDE ER 20 MEQ TABLET,EXTENDED RELEASE(PART/CRYST)
ORAL_TABLET | Freq: Every day | ORAL | 0 refills | 30 days
Start: 2021-12-18 — End: ?

## 2021-12-18 MED ORDER — BICTEGRAVIR 50 MG-EMTRICITABINE 200 MG-TENOFOVIR ALAFENAM 25 MG TABLET
ORAL_TABLET | Freq: Every day | ORAL | 3 refills | 90 days
Start: 2021-12-18 — End: 2022-12-18

## 2021-12-18 MED ORDER — FUROSEMIDE 40 MG TABLET
ORAL_TABLET | Freq: Every day | ORAL | 0 refills | 30 days
Start: 2021-12-18 — End: ?

## 2021-12-18 MED ORDER — METHYLNALTREXONE 150 MG TABLET
ORAL_TABLET | Freq: Every day | ORAL | 2 refills | 30 days
Start: 2021-12-18 — End: ?

## 2021-12-18 MED ORDER — POLYETHYLENE GLYCOL 3350 17 GRAM ORAL POWDER PACKET
PACK | Freq: Two times a day (BID) | ORAL | 0 refills | 30 days
Start: 2021-12-18 — End: ?

## 2021-12-18 MED ORDER — NYSTATIN 100,000 UNIT/GRAM TOPICAL POWDER
1 refills | 0 days
Start: 2021-12-18 — End: 2022-12-15

## 2021-12-18 MED ORDER — OMEPRAZOLE 20 MG CAPSULE,DELAYED RELEASE
ORAL_CAPSULE | Freq: Every day | ORAL | 0 refills | 30 days
Start: 2021-12-18 — End: ?

## 2021-12-18 MED ORDER — NALOXONE 4 MG/ACTUATION NASAL SPRAY
11 refills | 0 days
Start: 2021-12-18 — End: ?

## 2021-12-18 MED ORDER — NICOTINE 21 MG/24 HR DAILY TRANSDERMAL PATCH
MEDICATED_PATCH | Freq: Every day | TRANSDERMAL | 0 refills | 28 days
Start: 2021-12-18 — End: ?

## 2021-12-18 MED ORDER — NICOTINE (POLACRILEX) 4 MG GUM
BUCCAL | 0 refills | 5 days | PRN
Start: 2021-12-18 — End: 2022-01-17

## 2021-12-18 MED ORDER — METOPROLOL SUCCINATE ER 25 MG TABLET,EXTENDED RELEASE 24 HR
ORAL_TABLET | Freq: Every day | ORAL | 0 refills | 30 days
Start: 2021-12-18 — End: ?

## 2021-12-21 MED ORDER — METOPROLOL SUCCINATE ER 25 MG TABLET,EXTENDED RELEASE 24 HR
ORAL_TABLET | Freq: Every day | ORAL | 0 refills | 30 days | Status: CP
Start: 2021-12-21 — End: ?
  Filled 2021-12-21: qty 30, 30d supply, fill #0

## 2021-12-21 MED ORDER — NALOXONE 4 MG/ACTUATION NASAL SPRAY
11 refills | 0 days | Status: CP
Start: 2021-12-21 — End: 2021-12-21
  Filled 2021-12-21: qty 2, 1d supply, fill #0

## 2021-12-21 MED ORDER — POTASSIUM CHLORIDE ER 20 MEQ TABLET,EXTENDED RELEASE(PART/CRYST)
ORAL_TABLET | Freq: Every day | ORAL | 0 refills | 30 days | Status: CP
Start: 2021-12-21 — End: 2021-12-21

## 2021-12-21 MED ORDER — OXYCODONE 5 MG TABLET
ORAL_TABLET | ORAL | 0 refills | 2 days | Status: CN | PRN
Start: 2021-12-21 — End: 2021-12-26

## 2021-12-21 MED ORDER — OXYCODONE 10 MG TABLET
ORAL_TABLET | ORAL | 0 refills | 2 days | Status: CN | PRN
Start: 2021-12-21 — End: 2021-12-26

## 2021-12-21 MED ORDER — OMEPRAZOLE 20 MG CAPSULE,DELAYED RELEASE
ORAL_CAPSULE | Freq: Every day | ORAL | 0 refills | 30 days | Status: CP
Start: 2021-12-21 — End: ?
  Filled 2021-12-21: qty 30, 30d supply, fill #0

## 2021-12-21 MED ORDER — NYSTATIN 100,000 UNIT/GRAM TOPICAL POWDER
1 refills | 0 days | Status: CP
Start: 2021-12-21 — End: 2022-12-15
  Filled 2021-12-21: qty 60, 30d supply, fill #0

## 2021-12-21 MED ORDER — NAPROXEN 500 MG TABLET
ORAL_TABLET | Freq: Two times a day (BID) | ORAL | 0 refills | 15 days | Status: CP
Start: 2021-12-21 — End: 2021-12-21

## 2021-12-21 MED ORDER — NICOTINE (POLACRILEX) 4 MG GUM
BUCCAL | 0 refills | 5 days | Status: CP | PRN
Start: 2021-12-21 — End: 2022-01-20

## 2021-12-21 MED ORDER — POLYETHYLENE GLYCOL 3350 17 GRAM/DOSE ORAL POWDER
Freq: Two times a day (BID) | ORAL | 0 refills | 30 days | Status: CP
Start: 2021-12-21 — End: 2022-01-20
  Filled 2021-12-21: qty 1020, 30d supply, fill #0

## 2021-12-21 MED ORDER — FUROSEMIDE 40 MG TABLET
ORAL_TABLET | Freq: Every day | ORAL | 0 refills | 30 days | Status: CP
Start: 2021-12-21 — End: ?
  Filled 2021-12-21: qty 30, 30d supply, fill #0

## 2021-12-21 MED ORDER — TRAZODONE 150 MG TABLET
ORAL_TABLET | Freq: Every evening | ORAL | 0 refills | 30 days | Status: CP
Start: 2021-12-21 — End: 2022-01-20
  Filled 2021-12-21: qty 30, 30d supply, fill #0

## 2021-12-21 MED ORDER — NICOTINE (POLACRILEX) 4 MG BUCCAL LOZENGE
BUCCAL | 0 refills | 5.00000 days | Status: CP | PRN
Start: 2021-12-21 — End: 2021-12-21
  Filled 2021-12-21: qty 72, 5d supply, fill #0

## 2021-12-22 MED ORDER — FERROUS SULFATE 325 MG (65 MG IRON) TABLET
ORAL_TABLET | ORAL | 0 refills | 30.00000 days | Status: CP
Start: 2021-12-22 — End: 2021-12-21
  Filled 2021-12-21: qty 15, 30d supply, fill #0

## 2021-12-22 MED ORDER — MEDROXYPROGESTERONE 10 MG TABLET
ORAL_TABLET | Freq: Every day | ORAL | 0 refills | 30 days | Status: CP
Start: 2021-12-22 — End: 2022-01-21

## 2021-12-22 MED ORDER — NICOTINE 21 MG/24 HR DAILY TRANSDERMAL PATCH
MEDICATED_PATCH | Freq: Every day | TRANSDERMAL | 0 refills | 28.00000 days | Status: CP
Start: 2021-12-22 — End: 2021-12-21
  Filled 2021-12-21: qty 28, 28d supply, fill #0

## 2021-12-22 MED ORDER — NALOXEGOL 25 MG TABLET
ORAL_TABLET | Freq: Every day | ORAL | 0 refills | 30 days | Status: CP
Start: 2021-12-22 — End: 2021-12-21
  Filled 2021-12-21: qty 30, 30d supply, fill #0

## 2021-12-26 ENCOUNTER — Telehealth: Admit: 2021-12-26 | Discharge: 2021-12-27 | Payer: MEDICARE

## 2021-12-27 ENCOUNTER — Telehealth: Admit: 2021-12-27 | Discharge: 2021-12-28 | Payer: MEDICARE

## 2021-12-27 DIAGNOSIS — Z7409 Other reduced mobility: Principal | ICD-10-CM

## 2021-12-27 DIAGNOSIS — N939 Abnormal uterine and vaginal bleeding, unspecified: Principal | ICD-10-CM

## 2021-12-27 MED ORDER — METOPROLOL SUCCINATE ER 50 MG TABLET,EXTENDED RELEASE 24 HR
ORAL_TABLET | Freq: Every day | ORAL | 3 refills | 90 days | Status: CP
Start: 2021-12-27 — End: ?

## 2022-01-25 DIAGNOSIS — M545 Low back pain, unspecified: Principal | ICD-10-CM

## 2022-01-25 DIAGNOSIS — R52 Pain, unspecified: Principal | ICD-10-CM

## 2022-01-25 MED ORDER — OXYCODONE-ACETAMINOPHEN 7.5 MG-325 MG TABLET
ORAL_TABLET | 0 refills | 0 days
Start: 2022-01-25 — End: ?

## 2022-01-26 ENCOUNTER — Telehealth: Admit: 2022-01-26 | Discharge: 2022-01-27 | Payer: MEDICARE

## 2022-01-26 DIAGNOSIS — M1610 Unilateral primary osteoarthritis, unspecified hip: Principal | ICD-10-CM

## 2022-01-26 DIAGNOSIS — G9050 Complex regional pain syndrome I, unspecified: Principal | ICD-10-CM

## 2022-01-26 DIAGNOSIS — B20 Human immunodeficiency virus [HIV] disease: Principal | ICD-10-CM

## 2022-01-26 MED ORDER — ALLOPURINOL 300 MG TABLET
ORAL_TABLET | Freq: Every day | ORAL | 3 refills | 90 days | Status: CP
Start: 2022-01-26 — End: ?

## 2022-01-26 MED ORDER — OMEPRAZOLE 20 MG CAPSULE,DELAYED RELEASE
ORAL_CAPSULE | Freq: Every day | ORAL | 0 refills | 90 days | Status: CP
Start: 2022-01-26 — End: ?

## 2022-01-26 MED ORDER — PREGABALIN 50 MG CAPSULE
ORAL_CAPSULE | 0 refills | 0 days | Status: CP
Start: 2022-01-26 — End: ?

## 2022-01-26 MED ORDER — MOUNJARO 10 MG/0.5 ML SUBCUTANEOUS PEN INJECTOR
SUBCUTANEOUS | 3 refills | 0 days | Status: CP
Start: 2022-01-26 — End: ?

## 2022-01-26 MED ORDER — OXYCODONE-ACETAMINOPHEN 5 MG-325 MG TABLET
ORAL_TABLET | Freq: Three times a day (TID) | ORAL | 0 refills | 5 days | Status: CP | PRN
Start: 2022-01-26 — End: ?

## 2022-01-26 MED ORDER — BICTEGRAVIR 50 MG-EMTRICITABINE 200 MG-TENOFOVIR ALAFENAM 25 MG TABLET
ORAL_TABLET | Freq: Every day | ORAL | 1 refills | 90 days | Status: CP
Start: 2022-01-26 — End: 2023-01-26

## 2022-01-26 MED ORDER — METOPROLOL SUCCINATE ER 50 MG TABLET,EXTENDED RELEASE 24 HR
ORAL_TABLET | Freq: Every day | ORAL | 3 refills | 90 days | Status: CP
Start: 2022-01-26 — End: ?

## 2022-01-26 MED ORDER — CYCLOBENZAPRINE 5 MG TABLET
ORAL_TABLET | Freq: Two times a day (BID) | ORAL | 0 refills | 45 days | Status: CP | PRN
Start: 2022-01-26 — End: ?

## 2022-01-26 MED ORDER — VRAYLAR 3 MG CAPSULE
ORAL_CAPSULE | Freq: Every day | ORAL | 0 refills | 60 days | Status: CP
Start: 2022-01-26 — End: 2023-01-26

## 2022-01-29 MED ORDER — OXYCODONE-ACETAMINOPHEN 7.5 MG-325 MG TABLET
ORAL_TABLET | 0 refills | 0 days
Start: 2022-01-29 — End: ?

## 2022-01-31 ENCOUNTER — Telehealth: Admit: 2022-01-31 | Discharge: 2022-02-01 | Payer: MEDICARE

## 2022-01-31 DIAGNOSIS — B20 Human immunodeficiency virus [HIV] disease: Principal | ICD-10-CM

## 2022-01-31 DIAGNOSIS — R0981 Nasal congestion: Principal | ICD-10-CM

## 2022-02-13 MED ORDER — OXYCODONE-ACETAMINOPHEN 5 MG-325 MG TABLET
ORAL_TABLET | Freq: Three times a day (TID) | ORAL | 0 refills | 5 days | Status: CP | PRN
Start: 2022-02-13 — End: ?

## 2022-02-14 ENCOUNTER — Telehealth: Admit: 2022-02-14 | Discharge: 2022-02-15 | Payer: MEDICARE

## 2022-02-14 DIAGNOSIS — R0602 Shortness of breath: Principal | ICD-10-CM

## 2022-02-14 DIAGNOSIS — M549 Dorsalgia, unspecified: Principal | ICD-10-CM

## 2022-02-14 DIAGNOSIS — J329 Chronic sinusitis, unspecified: Principal | ICD-10-CM

## 2022-02-16 ENCOUNTER — Telehealth: Admit: 2022-02-16 | Discharge: 2022-02-17 | Payer: MEDICARE | Attending: Adult Health | Primary: Adult Health

## 2022-02-16 DIAGNOSIS — I255 Ischemic cardiomyopathy: Principal | ICD-10-CM

## 2022-02-16 DIAGNOSIS — I5020 Unspecified systolic (congestive) heart failure: Principal | ICD-10-CM

## 2022-02-16 DIAGNOSIS — E782 Mixed hyperlipidemia: Principal | ICD-10-CM

## 2022-02-16 DIAGNOSIS — G4730 Sleep apnea, unspecified: Principal | ICD-10-CM

## 2022-02-21 ENCOUNTER — Telehealth: Admit: 2022-02-21 | Discharge: 2022-02-22 | Payer: MEDICARE

## 2022-02-21 DIAGNOSIS — J329 Chronic sinusitis, unspecified: Principal | ICD-10-CM

## 2022-02-21 DIAGNOSIS — M1610 Unilateral primary osteoarthritis, unspecified hip: Principal | ICD-10-CM

## 2022-02-21 DIAGNOSIS — G9050 Complex regional pain syndrome I, unspecified: Principal | ICD-10-CM

## 2022-02-21 MED ORDER — CEFDINIR 300 MG CAPSULE
ORAL_CAPSULE | Freq: Two times a day (BID) | ORAL | 0 refills | 10 days | Status: CP
Start: 2022-02-21 — End: ?

## 2022-02-21 MED ORDER — OXYCODONE-ACETAMINOPHEN 5 MG-325 MG TABLET
ORAL_TABLET | Freq: Three times a day (TID) | ORAL | 0 refills | 10 days | Status: CP | PRN
Start: 2022-02-21 — End: ?

## 2022-02-21 MED ORDER — DOXEPIN 10 MG CAPSULE
ORAL_CAPSULE | Freq: Every evening | ORAL | 3 refills | 90 days | Status: CP
Start: 2022-02-21 — End: 2023-02-21

## 2022-03-12 DIAGNOSIS — G9050 Complex regional pain syndrome I, unspecified: Principal | ICD-10-CM

## 2022-03-12 DIAGNOSIS — M1610 Unilateral primary osteoarthritis, unspecified hip: Principal | ICD-10-CM

## 2022-03-12 MED ORDER — OXYCODONE-ACETAMINOPHEN 5 MG-325 MG TABLET
ORAL_TABLET | Freq: Three times a day (TID) | ORAL | 0 refills | 10 days | Status: CP | PRN
Start: 2022-03-12 — End: ?

## 2022-03-14 DIAGNOSIS — B20 Human immunodeficiency virus [HIV] disease: Principal | ICD-10-CM

## 2022-03-14 MED ORDER — SPIRONOLACTONE 25 MG TABLET
ORAL_TABLET | Freq: Every day | ORAL | 0 refills | 90 days | Status: CP
Start: 2022-03-14 — End: ?

## 2022-03-14 MED ORDER — NALOXEGOL 25 MG TABLET
ORAL_TABLET | Freq: Every day | ORAL | 0 refills | 30 days | Status: CP
Start: 2022-03-14 — End: ?

## 2022-03-14 MED ORDER — TICAGRELOR 90 MG TABLET
ORAL_TABLET | Freq: Two times a day (BID) | ORAL | 1 refills | 90 days | Status: CP
Start: 2022-03-14 — End: 2023-03-14

## 2022-03-14 MED ORDER — ALLOPURINOL 300 MG TABLET
ORAL_TABLET | Freq: Every day | ORAL | 3 refills | 90 days | Status: CP
Start: 2022-03-14 — End: ?

## 2022-03-14 MED ORDER — SACUBITRIL 24 MG-VALSARTAN 26 MG TABLET
ORAL_TABLET | Freq: Two times a day (BID) | ORAL | 1 refills | 90 days | Status: CP
Start: 2022-03-14 — End: 2023-03-14

## 2022-03-14 MED ORDER — FUROSEMIDE 40 MG TABLET
ORAL_TABLET | Freq: Every day | ORAL | 0 refills | 30 days | Status: CP
Start: 2022-03-14 — End: ?

## 2022-03-14 MED ORDER — METOPROLOL SUCCINATE ER 50 MG TABLET,EXTENDED RELEASE 24 HR
ORAL_TABLET | Freq: Every day | ORAL | 3 refills | 90 days | Status: CP
Start: 2022-03-14 — End: ?

## 2022-03-14 MED ORDER — CYCLOBENZAPRINE 5 MG TABLET
ORAL_TABLET | Freq: Two times a day (BID) | ORAL | 0 refills | 45 days | Status: CP | PRN
Start: 2022-03-14 — End: ?

## 2022-03-14 MED ORDER — VRAYLAR 3 MG CAPSULE
ORAL_CAPSULE | Freq: Every day | ORAL | 0 refills | 60 days | Status: CP
Start: 2022-03-14 — End: 2023-03-14

## 2022-03-14 MED ORDER — MOUNJARO 10 MG/0.5 ML SUBCUTANEOUS PEN INJECTOR
SUBCUTANEOUS | 3 refills | 0 days | Status: CP
Start: 2022-03-14 — End: ?

## 2022-03-14 MED ORDER — BICTEGRAVIR 50 MG-EMTRICITABINE 200 MG-TENOFOVIR ALAFENAM 25 MG TABLET
ORAL_TABLET | Freq: Every day | ORAL | 1 refills | 90 days | Status: CP
Start: 2022-03-14 — End: 2023-03-14

## 2022-03-14 MED ORDER — ATORVASTATIN 80 MG TABLET
ORAL_TABLET | Freq: Every day | ORAL | 1 refills | 90 days | Status: CP
Start: 2022-03-14 — End: 2023-03-14

## 2022-03-14 MED ORDER — TRAZODONE 150 MG TABLET
ORAL_TABLET | Freq: Every evening | ORAL | 0 refills | 30 days | Status: CP
Start: 2022-03-14 — End: 2022-04-13

## 2022-03-14 MED ORDER — PREGABALIN 50 MG CAPSULE
ORAL_CAPSULE | 0 refills | 0 days | Status: CP
Start: 2022-03-14 — End: ?

## 2022-03-14 MED ORDER — OMEPRAZOLE 20 MG CAPSULE,DELAYED RELEASE
ORAL_CAPSULE | Freq: Every day | ORAL | 0 refills | 90 days | Status: CP
Start: 2022-03-14 — End: ?

## 2022-03-20 MED ORDER — OXYCODONE 5 MG TABLET
ORAL_TABLET | Freq: Two times a day (BID) | ORAL | 0 refills | 15 days | Status: CP | PRN
Start: 2022-03-20 — End: ?

## 2022-03-26 MED ORDER — TRAZODONE 150 MG TABLET
ORAL_TABLET | Freq: Every evening | ORAL | 11 refills | 0 days
Start: 2022-03-26 — End: ?

## 2022-03-26 MED ORDER — FUROSEMIDE 40 MG TABLET
ORAL_TABLET | Freq: Every day | ORAL | 11 refills | 0 days
Start: 2022-03-26 — End: ?

## 2022-03-27 MED ORDER — TRAZODONE 150 MG TABLET
ORAL_TABLET | Freq: Every evening | ORAL | 11 refills | 30 days
Start: 2022-03-27 — End: ?

## 2022-03-27 MED ORDER — FUROSEMIDE 40 MG TABLET
ORAL_TABLET | Freq: Every day | ORAL | 11 refills | 30 days
Start: 2022-03-27 — End: ?

## 2022-03-28 MED ORDER — VRAYLAR 3 MG CAPSULE
ORAL_CAPSULE | Freq: Every day | ORAL | 11 refills | 30 days | Status: CP
Start: 2022-03-28 — End: ?

## 2022-04-16 MED ORDER — MEDROXYPROGESTERONE 10 MG TABLET
ORAL_TABLET | Freq: Every day | ORAL | 11 refills | 30 days
Start: 2022-04-16 — End: ?

## 2022-04-23 MED ORDER — MOVANTIK 25 MG TABLET
ORAL_TABLET | Freq: Every day | ORAL | 0 refills | 0 days
Start: 2022-04-23 — End: ?

## 2022-04-23 MED ORDER — TRAZODONE 150 MG TABLET
ORAL_TABLET | Freq: Every evening | ORAL | 11 refills | 0 days
Start: 2022-04-23 — End: ?

## 2022-04-23 MED ORDER — PREGABALIN 50 MG CAPSULE
ORAL_CAPSULE | 0 refills | 0 days
Start: 2022-04-23 — End: ?

## 2022-04-25 MED ORDER — MOVANTIK 25 MG TABLET
ORAL_TABLET | Freq: Every day | ORAL | 0 refills | 30 days
Start: 2022-04-25 — End: ?

## 2022-04-25 MED ORDER — PREGABALIN 50 MG CAPSULE
ORAL_CAPSULE | 0 refills | 0 days
Start: 2022-04-25 — End: ?

## 2022-04-25 MED ORDER — TRAZODONE 150 MG TABLET
ORAL_TABLET | Freq: Every evening | ORAL | 11 refills | 30 days
Start: 2022-04-25 — End: ?

## 2022-04-29 MED ORDER — SPIRONOLACTONE 25 MG TABLET
ORAL_TABLET | Freq: Every day | ORAL | 3 refills | 0 days
Start: 2022-04-29 — End: ?

## 2022-04-30 MED ORDER — SPIRONOLACTONE 25 MG TABLET
ORAL_TABLET | Freq: Every day | ORAL | 1 refills | 90 days | Status: CP
Start: 2022-04-30 — End: ?

## 2022-04-30 MED ORDER — MOVANTIK 25 MG TABLET
ORAL_TABLET | Freq: Every day | ORAL | 0 refills | 0 days
Start: 2022-04-30 — End: ?

## 2022-05-01 MED ORDER — MOVANTIK 25 MG TABLET
ORAL_TABLET | Freq: Every day | ORAL | 0 refills | 30 days
Start: 2022-05-01 — End: ?

## 2022-05-08 MED ORDER — BRILINTA 90 MG TABLET
ORAL_TABLET | ORAL | 2 refills | 0 days
Start: 2022-05-08 — End: ?

## 2022-05-08 MED ORDER — ATORVASTATIN 80 MG TABLET
ORAL_TABLET | Freq: Every day | ORAL | 2 refills | 0 days
Start: 2022-05-08 — End: ?

## 2022-05-08 MED ORDER — ENTRESTO 24 MG-26 MG TABLET
ORAL_TABLET | ORAL | 2 refills | 0 days
Start: 2022-05-08 — End: ?

## 2022-05-09 MED ORDER — ENTRESTO 24 MG-26 MG TABLET
ORAL_TABLET | ORAL | 2 refills | 0 days
Start: 2022-05-09 — End: ?

## 2022-05-09 MED ORDER — ATORVASTATIN 80 MG TABLET
ORAL_TABLET | Freq: Every day | ORAL | 2 refills | 100 days
Start: 2022-05-09 — End: ?

## 2022-05-09 MED ORDER — BRILINTA 90 MG TABLET
ORAL_TABLET | ORAL | 2 refills | 0 days
Start: 2022-05-09 — End: ?

## 2022-05-15 MED ORDER — OMEPRAZOLE 20 MG CAPSULE,DELAYED RELEASE
ORAL_CAPSULE | 3 refills | 0 days
Start: 2022-05-15 — End: ?

## 2022-05-16 MED ORDER — OMEPRAZOLE 20 MG CAPSULE,DELAYED RELEASE
ORAL_CAPSULE | 1 refills | 0 days | Status: CP
Start: 2022-05-16 — End: ?

## 2022-05-17 MED ORDER — MOUNJARO 10 MG/0.5 ML SUBCUTANEOUS PEN INJECTOR
3 refills | 0 days
Start: 2022-05-17 — End: ?

## 2022-05-27 MED ORDER — ATORVASTATIN 80 MG TABLET
ORAL_TABLET | Freq: Every day | ORAL | 2 refills | 0 days
Start: 2022-05-27 — End: ?

## 2022-05-27 MED ORDER — BRILINTA 90 MG TABLET
ORAL_TABLET | ORAL | 2 refills | 0 days
Start: 2022-05-27 — End: ?

## 2022-05-27 MED ORDER — ENTRESTO 24 MG-26 MG TABLET
ORAL_TABLET | ORAL | 2 refills | 0 days
Start: 2022-05-27 — End: ?

## 2022-05-29 MED ORDER — BRILINTA 90 MG TABLET
ORAL_TABLET | ORAL | 2 refills | 0 days
Start: 2022-05-29 — End: ?

## 2022-05-29 MED ORDER — ATORVASTATIN 80 MG TABLET
ORAL_TABLET | Freq: Every day | ORAL | 2 refills | 100 days
Start: 2022-05-29 — End: ?

## 2022-05-29 MED ORDER — ENTRESTO 24 MG-26 MG TABLET
ORAL_TABLET | ORAL | 2 refills | 0 days
Start: 2022-05-29 — End: ?
